# Patient Record
Sex: Female | Born: 1975 | Race: White | Hispanic: No | Marital: Single | Smoking: Current every day smoker
Health system: Southern US, Community
[De-identification: ages and names within clinical notes are randomized; demographics above are authoritative.]

## PROBLEM LIST (undated history)

## (undated) ENCOUNTER — Inpatient Hospital Stay (HOSPITAL_COMMUNITY): Payer: Self-pay

## (undated) DIAGNOSIS — F32A Depression, unspecified: Secondary | ICD-10-CM

## (undated) DIAGNOSIS — O09299 Supervision of pregnancy with other poor reproductive or obstetric history, unspecified trimester: Secondary | ICD-10-CM

## (undated) DIAGNOSIS — I8390 Asymptomatic varicose veins of unspecified lower extremity: Secondary | ICD-10-CM

## (undated) DIAGNOSIS — R87629 Unspecified abnormal cytological findings in specimens from vagina: Secondary | ICD-10-CM

## (undated) DIAGNOSIS — A630 Anogenital (venereal) warts: Secondary | ICD-10-CM

## (undated) DIAGNOSIS — N879 Dysplasia of cervix uteri, unspecified: Secondary | ICD-10-CM

## (undated) DIAGNOSIS — N390 Urinary tract infection, site not specified: Secondary | ICD-10-CM

## (undated) DIAGNOSIS — L309 Dermatitis, unspecified: Secondary | ICD-10-CM

## (undated) DIAGNOSIS — F329 Major depressive disorder, single episode, unspecified: Secondary | ICD-10-CM

## (undated) DIAGNOSIS — O24419 Gestational diabetes mellitus in pregnancy, unspecified control: Secondary | ICD-10-CM

## (undated) DIAGNOSIS — N898 Other specified noninflammatory disorders of vagina: Secondary | ICD-10-CM

## (undated) HISTORY — DX: Gestational diabetes mellitus in pregnancy, unspecified control: O24.419

## (undated) HISTORY — DX: Dermatitis, unspecified: L30.9

## (undated) HISTORY — DX: Dysplasia of cervix uteri, unspecified: N87.9

## (undated) HISTORY — DX: Depression, unspecified: F32.A

## (undated) HISTORY — PX: COLPOSCOPY: SHX161

## (undated) HISTORY — DX: Unspecified abnormal cytological findings in specimens from vagina: R87.629

## (undated) HISTORY — DX: Major depressive disorder, single episode, unspecified: F32.9

## (undated) HISTORY — DX: Anogenital (venereal) warts: A63.0

## (undated) HISTORY — DX: Other specified noninflammatory disorders of vagina: N89.8

## (undated) HISTORY — DX: Asymptomatic varicose veins of unspecified lower extremity: I83.90

## (undated) HISTORY — DX: Supervision of pregnancy with other poor reproductive or obstetric history, unspecified trimester: O09.299

---

## 1997-08-01 HISTORY — PX: WISDOM TOOTH EXTRACTION: SHX21

## 1998-02-23 ENCOUNTER — Other Ambulatory Visit: Admission: RE | Admit: 1998-02-23 | Discharge: 1998-02-23 | Payer: Self-pay | Admitting: Obstetrics and Gynecology

## 1998-09-15 ENCOUNTER — Other Ambulatory Visit: Admission: RE | Admit: 1998-09-15 | Discharge: 1998-09-15 | Payer: Self-pay | Admitting: Family Medicine

## 1998-10-27 ENCOUNTER — Inpatient Hospital Stay (HOSPITAL_COMMUNITY): Admission: AD | Admit: 1998-10-27 | Discharge: 1998-10-27 | Payer: Self-pay | Admitting: *Deleted

## 1998-11-03 ENCOUNTER — Inpatient Hospital Stay (HOSPITAL_COMMUNITY): Admission: AD | Admit: 1998-11-03 | Discharge: 1998-11-03 | Payer: Self-pay | Admitting: *Deleted

## 1998-11-28 ENCOUNTER — Inpatient Hospital Stay (HOSPITAL_COMMUNITY): Admission: AD | Admit: 1998-11-28 | Discharge: 1998-11-28 | Payer: Self-pay | Admitting: Obstetrics

## 1998-12-22 ENCOUNTER — Inpatient Hospital Stay (HOSPITAL_COMMUNITY): Admission: AD | Admit: 1998-12-22 | Discharge: 1998-12-22 | Payer: Self-pay | Admitting: *Deleted

## 1998-12-24 ENCOUNTER — Inpatient Hospital Stay (HOSPITAL_COMMUNITY): Admission: AD | Admit: 1998-12-24 | Discharge: 1998-12-24 | Payer: Self-pay | Admitting: Obstetrics & Gynecology

## 1999-01-02 ENCOUNTER — Inpatient Hospital Stay (HOSPITAL_COMMUNITY): Admission: AD | Admit: 1999-01-02 | Discharge: 1999-01-02 | Payer: Self-pay | Admitting: *Deleted

## 1999-01-06 ENCOUNTER — Inpatient Hospital Stay (HOSPITAL_COMMUNITY): Admission: AD | Admit: 1999-01-06 | Discharge: 1999-01-06 | Payer: Self-pay | Admitting: Obstetrics

## 1999-01-15 ENCOUNTER — Inpatient Hospital Stay (HOSPITAL_COMMUNITY): Admission: AD | Admit: 1999-01-15 | Discharge: 1999-01-15 | Payer: Self-pay | Admitting: Obstetrics

## 1999-01-22 ENCOUNTER — Inpatient Hospital Stay (HOSPITAL_COMMUNITY): Admission: AD | Admit: 1999-01-22 | Discharge: 1999-01-22 | Payer: Self-pay | Admitting: Obstetrics

## 1999-01-27 ENCOUNTER — Inpatient Hospital Stay (HOSPITAL_COMMUNITY): Admission: AD | Admit: 1999-01-27 | Discharge: 1999-01-27 | Payer: Self-pay | Admitting: Obstetrics

## 1999-01-29 ENCOUNTER — Encounter: Payer: Self-pay | Admitting: Obstetrics

## 1999-01-29 ENCOUNTER — Ambulatory Visit (HOSPITAL_COMMUNITY): Admission: RE | Admit: 1999-01-29 | Discharge: 1999-01-29 | Payer: Self-pay | Admitting: Obstetrics

## 1999-02-06 ENCOUNTER — Inpatient Hospital Stay (HOSPITAL_COMMUNITY): Admission: AD | Admit: 1999-02-06 | Discharge: 1999-02-06 | Payer: Self-pay | Admitting: Obstetrics

## 1999-02-07 ENCOUNTER — Inpatient Hospital Stay (HOSPITAL_COMMUNITY): Admission: AD | Admit: 1999-02-07 | Discharge: 1999-02-07 | Payer: Self-pay | Admitting: Obstetrics & Gynecology

## 1999-02-16 ENCOUNTER — Inpatient Hospital Stay (HOSPITAL_COMMUNITY): Admission: AD | Admit: 1999-02-16 | Discharge: 1999-02-16 | Payer: Self-pay | Admitting: *Deleted

## 1999-02-22 ENCOUNTER — Inpatient Hospital Stay (HOSPITAL_COMMUNITY): Admission: AD | Admit: 1999-02-22 | Discharge: 1999-02-22 | Payer: Self-pay | Admitting: Obstetrics

## 1999-03-08 ENCOUNTER — Inpatient Hospital Stay (HOSPITAL_COMMUNITY): Admission: AD | Admit: 1999-03-08 | Discharge: 1999-03-08 | Payer: Self-pay | Admitting: Obstetrics & Gynecology

## 1999-03-09 ENCOUNTER — Inpatient Hospital Stay (HOSPITAL_COMMUNITY): Admission: RE | Admit: 1999-03-09 | Discharge: 1999-03-09 | Payer: Self-pay | Admitting: Obstetrics & Gynecology

## 1999-03-29 ENCOUNTER — Inpatient Hospital Stay (HOSPITAL_COMMUNITY): Admission: AD | Admit: 1999-03-29 | Discharge: 1999-03-29 | Payer: Self-pay | Admitting: *Deleted

## 1999-04-13 ENCOUNTER — Ambulatory Visit (HOSPITAL_COMMUNITY): Admission: RE | Admit: 1999-04-13 | Discharge: 1999-04-13 | Payer: Self-pay | Admitting: *Deleted

## 1999-05-03 ENCOUNTER — Inpatient Hospital Stay (HOSPITAL_COMMUNITY): Admission: AD | Admit: 1999-05-03 | Discharge: 1999-05-03 | Payer: Self-pay | Admitting: Obstetrics

## 1999-05-06 ENCOUNTER — Inpatient Hospital Stay (HOSPITAL_COMMUNITY): Admission: AD | Admit: 1999-05-06 | Discharge: 1999-05-06 | Payer: Self-pay | Admitting: Obstetrics & Gynecology

## 1999-05-11 ENCOUNTER — Inpatient Hospital Stay (HOSPITAL_COMMUNITY): Admission: AD | Admit: 1999-05-11 | Discharge: 1999-05-11 | Payer: Self-pay | Admitting: *Deleted

## 1999-05-28 ENCOUNTER — Inpatient Hospital Stay (HOSPITAL_COMMUNITY): Admission: AD | Admit: 1999-05-28 | Discharge: 1999-05-28 | Payer: Self-pay | Admitting: *Deleted

## 1999-06-18 ENCOUNTER — Inpatient Hospital Stay (HOSPITAL_COMMUNITY): Admission: AD | Admit: 1999-06-18 | Discharge: 1999-06-18 | Payer: Self-pay | Admitting: *Deleted

## 1999-06-24 ENCOUNTER — Inpatient Hospital Stay (HOSPITAL_COMMUNITY): Admission: AD | Admit: 1999-06-24 | Discharge: 1999-06-24 | Payer: Self-pay | Admitting: *Deleted

## 1999-06-25 ENCOUNTER — Encounter (HOSPITAL_COMMUNITY): Admission: RE | Admit: 1999-06-25 | Discharge: 1999-07-01 | Payer: Self-pay | Admitting: *Deleted

## 1999-06-29 ENCOUNTER — Inpatient Hospital Stay (HOSPITAL_COMMUNITY): Admission: AD | Admit: 1999-06-29 | Discharge: 1999-07-04 | Payer: Self-pay | Admitting: *Deleted

## 1999-07-05 ENCOUNTER — Inpatient Hospital Stay (HOSPITAL_COMMUNITY): Admission: EM | Admit: 1999-07-05 | Discharge: 1999-07-05 | Payer: Self-pay | Admitting: Obstetrics & Gynecology

## 1999-07-15 ENCOUNTER — Inpatient Hospital Stay (HOSPITAL_COMMUNITY): Admission: AD | Admit: 1999-07-15 | Discharge: 1999-07-15 | Payer: Self-pay | Admitting: *Deleted

## 1999-08-01 ENCOUNTER — Inpatient Hospital Stay (HOSPITAL_COMMUNITY): Admission: AD | Admit: 1999-08-01 | Discharge: 1999-08-01 | Payer: Self-pay | Admitting: Obstetrics & Gynecology

## 1999-08-26 ENCOUNTER — Inpatient Hospital Stay (HOSPITAL_COMMUNITY): Admission: AD | Admit: 1999-08-26 | Discharge: 1999-08-26 | Payer: Self-pay | Admitting: Obstetrics & Gynecology

## 2000-05-23 ENCOUNTER — Other Ambulatory Visit: Admission: RE | Admit: 2000-05-23 | Discharge: 2000-05-23 | Payer: Self-pay | Admitting: Family Medicine

## 2002-06-04 ENCOUNTER — Other Ambulatory Visit: Admission: RE | Admit: 2002-06-04 | Discharge: 2002-06-04 | Payer: Self-pay | Admitting: Family Medicine

## 2003-11-21 ENCOUNTER — Encounter (INDEPENDENT_AMBULATORY_CARE_PROVIDER_SITE_OTHER): Payer: Self-pay | Admitting: Specialist

## 2003-11-21 ENCOUNTER — Encounter: Admission: RE | Admit: 2003-11-21 | Discharge: 2003-11-21 | Payer: Self-pay | Admitting: Family Medicine

## 2003-11-21 ENCOUNTER — Other Ambulatory Visit: Admission: RE | Admit: 2003-11-21 | Discharge: 2003-11-21 | Payer: Self-pay | Admitting: Family Medicine

## 2003-11-21 ENCOUNTER — Encounter (INDEPENDENT_AMBULATORY_CARE_PROVIDER_SITE_OTHER): Payer: Self-pay | Admitting: *Deleted

## 2003-12-04 ENCOUNTER — Encounter: Admission: RE | Admit: 2003-12-04 | Discharge: 2003-12-04 | Payer: Self-pay | Admitting: Obstetrics and Gynecology

## 2003-12-25 ENCOUNTER — Emergency Department (HOSPITAL_COMMUNITY): Admission: EM | Admit: 2003-12-25 | Discharge: 2003-12-25 | Payer: Self-pay | Admitting: Family Medicine

## 2003-12-26 ENCOUNTER — Emergency Department (HOSPITAL_COMMUNITY): Admission: EM | Admit: 2003-12-26 | Discharge: 2003-12-26 | Payer: Self-pay | Admitting: Family Medicine

## 2004-01-12 ENCOUNTER — Emergency Department (HOSPITAL_COMMUNITY): Admission: EM | Admit: 2004-01-12 | Discharge: 2004-01-12 | Payer: Self-pay | Admitting: Family Medicine

## 2004-01-28 ENCOUNTER — Ambulatory Visit (HOSPITAL_COMMUNITY): Admission: RE | Admit: 2004-01-28 | Discharge: 2004-01-28 | Payer: Self-pay | Admitting: Family Medicine

## 2004-01-28 ENCOUNTER — Encounter: Payer: Self-pay | Admitting: Cardiology

## 2004-04-09 ENCOUNTER — Ambulatory Visit: Payer: Self-pay | Admitting: Internal Medicine

## 2004-04-23 ENCOUNTER — Ambulatory Visit: Payer: Self-pay | Admitting: Internal Medicine

## 2004-06-01 ENCOUNTER — Emergency Department (HOSPITAL_COMMUNITY): Admission: EM | Admit: 2004-06-01 | Discharge: 2004-06-01 | Payer: Self-pay | Admitting: Family Medicine

## 2004-06-03 ENCOUNTER — Ambulatory Visit: Payer: Self-pay | Admitting: Family Medicine

## 2004-06-07 ENCOUNTER — Ambulatory Visit: Payer: Self-pay | Admitting: Internal Medicine

## 2005-03-11 ENCOUNTER — Ambulatory Visit: Payer: Self-pay | Admitting: Internal Medicine

## 2005-03-11 ENCOUNTER — Emergency Department (HOSPITAL_COMMUNITY): Admission: EM | Admit: 2005-03-11 | Discharge: 2005-03-11 | Payer: Self-pay | Admitting: Emergency Medicine

## 2005-03-28 ENCOUNTER — Ambulatory Visit: Payer: Self-pay | Admitting: Internal Medicine

## 2005-05-25 ENCOUNTER — Ambulatory Visit: Payer: Self-pay | Admitting: Internal Medicine

## 2005-07-28 ENCOUNTER — Emergency Department (HOSPITAL_COMMUNITY): Admission: EM | Admit: 2005-07-28 | Discharge: 2005-07-28 | Payer: Self-pay | Admitting: Family Medicine

## 2005-11-16 ENCOUNTER — Ambulatory Visit: Payer: Self-pay | Admitting: Obstetrics & Gynecology

## 2005-12-21 ENCOUNTER — Ambulatory Visit: Payer: Self-pay | Admitting: Obstetrics & Gynecology

## 2005-12-22 ENCOUNTER — Inpatient Hospital Stay (HOSPITAL_COMMUNITY): Admission: AD | Admit: 2005-12-22 | Discharge: 2005-12-22 | Payer: Self-pay | Admitting: Obstetrics & Gynecology

## 2005-12-26 ENCOUNTER — Inpatient Hospital Stay (HOSPITAL_COMMUNITY): Admission: AD | Admit: 2005-12-26 | Discharge: 2005-12-26 | Payer: Self-pay | Admitting: Gynecology

## 2005-12-28 ENCOUNTER — Ambulatory Visit: Payer: Self-pay | Admitting: Obstetrics & Gynecology

## 2006-03-29 ENCOUNTER — Ambulatory Visit: Payer: Self-pay | Admitting: Internal Medicine

## 2006-05-12 ENCOUNTER — Inpatient Hospital Stay (HOSPITAL_COMMUNITY): Admission: AD | Admit: 2006-05-12 | Discharge: 2006-05-13 | Payer: Self-pay | Admitting: Family Medicine

## 2006-06-02 ENCOUNTER — Emergency Department (HOSPITAL_COMMUNITY): Admission: EM | Admit: 2006-06-02 | Discharge: 2006-06-02 | Payer: Self-pay | Admitting: Emergency Medicine

## 2006-10-16 ENCOUNTER — Ambulatory Visit: Payer: Self-pay | Admitting: Internal Medicine

## 2006-10-16 DIAGNOSIS — N946 Dysmenorrhea, unspecified: Secondary | ICD-10-CM | POA: Insufficient documentation

## 2006-10-16 DIAGNOSIS — R002 Palpitations: Secondary | ICD-10-CM

## 2006-10-16 DIAGNOSIS — F329 Major depressive disorder, single episode, unspecified: Secondary | ICD-10-CM

## 2006-10-16 DIAGNOSIS — F410 Panic disorder [episodic paroxysmal anxiety] without agoraphobia: Secondary | ICD-10-CM

## 2006-10-16 DIAGNOSIS — Z87410 Personal history of cervical dysplasia: Secondary | ICD-10-CM

## 2006-10-18 ENCOUNTER — Emergency Department (HOSPITAL_COMMUNITY): Admission: EM | Admit: 2006-10-18 | Discharge: 2006-10-18 | Payer: Self-pay | Admitting: Family Medicine

## 2006-10-26 ENCOUNTER — Telehealth: Payer: Self-pay | Admitting: *Deleted

## 2006-11-20 ENCOUNTER — Ambulatory Visit: Payer: Self-pay | Admitting: Internal Medicine

## 2006-11-20 ENCOUNTER — Encounter (INDEPENDENT_AMBULATORY_CARE_PROVIDER_SITE_OTHER): Payer: Self-pay | Admitting: Internal Medicine

## 2006-11-20 LAB — CONVERTED CEMR LAB
ALT: 10 units/L (ref 0–35)
AST: 16 units/L (ref 0–37)
Albumin: 4.6 g/dL (ref 3.5–5.2)
Alkaline Phosphatase: 57 units/L (ref 39–117)
BUN: 12 mg/dL (ref 6–23)
CO2: 25 meq/L (ref 19–32)
Calcium: 9.5 mg/dL (ref 8.4–10.5)
Chloride: 104 meq/L (ref 96–112)
Creatinine, Ser: 0.82 mg/dL (ref 0.40–1.20)
Glucose, Bld: 87 mg/dL (ref 70–99)
HCT: 44.1 % (ref 36.0–46.0)
Hemoglobin: 14.9 g/dL (ref 12.0–15.0)
MCHC: 33.8 g/dL (ref 30.0–36.0)
MCV: 89.1 fL (ref 78.0–100.0)
Platelets: 341 10*3/uL (ref 150–400)
Potassium: 3.7 meq/L (ref 3.5–5.3)
RBC: 4.95 M/uL (ref 3.87–5.11)
RDW: 12.8 % (ref 11.5–14.0)
Sodium: 140 meq/L (ref 135–145)
TSH: 1.098 microintl units/mL (ref 0.350–5.50)
Total Bilirubin: 0.8 mg/dL (ref 0.3–1.2)
Total Protein: 7.7 g/dL (ref 6.0–8.3)
WBC: 6.2 10*3/uL (ref 4.0–10.5)

## 2007-03-26 ENCOUNTER — Ambulatory Visit: Payer: Self-pay | Admitting: Internal Medicine

## 2007-05-14 ENCOUNTER — Ambulatory Visit: Payer: Self-pay | Admitting: Infectious Diseases

## 2007-06-27 ENCOUNTER — Emergency Department (HOSPITAL_COMMUNITY): Admission: EM | Admit: 2007-06-27 | Discharge: 2007-06-27 | Payer: Self-pay | Admitting: Family Medicine

## 2008-02-27 ENCOUNTER — Ambulatory Visit: Payer: Self-pay | Admitting: Internal Medicine

## 2008-02-27 LAB — CONVERTED CEMR LAB: Beta hcg, urine, semiquantitative: NEGATIVE

## 2008-05-07 ENCOUNTER — Ambulatory Visit: Payer: Self-pay | Admitting: Infectious Disease

## 2008-08-11 ENCOUNTER — Ambulatory Visit: Payer: Self-pay | Admitting: Internal Medicine

## 2008-08-11 ENCOUNTER — Encounter: Payer: Self-pay | Admitting: Internal Medicine

## 2008-08-11 DIAGNOSIS — L708 Other acne: Secondary | ICD-10-CM | POA: Insufficient documentation

## 2008-08-11 LAB — CONVERTED CEMR LAB
ALT: 9 units/L (ref 0–35)
AST: 15 units/L (ref 0–37)
Albumin: 4.2 g/dL (ref 3.5–5.2)
Alkaline Phosphatase: 57 units/L (ref 39–117)
BUN: 8 mg/dL (ref 6–23)
Basophils Absolute: 0.1 10*3/uL (ref 0.0–0.1)
Basophils Relative: 1 % (ref 0–1)
CO2: 20 meq/L (ref 19–32)
Calcium: 9.4 mg/dL (ref 8.4–10.5)
Chloride: 105 meq/L (ref 96–112)
Cholesterol: 144 mg/dL (ref 0–200)
Creatinine, Ser: 0.68 mg/dL (ref 0.40–1.20)
Eosinophils Absolute: 0.2 10*3/uL (ref 0.0–0.7)
Eosinophils Relative: 3 % (ref 0–5)
Glucose, Bld: 81 mg/dL (ref 70–99)
HCT: 42.5 % (ref 36.0–46.0)
HDL: 57 mg/dL (ref >39)
Hemoglobin: 14 g/dL (ref 12.0–15.0)
LDL Cholesterol: 68 mg/dL (ref 0–99)
Lymphocytes Relative: 17 % (ref 12–46)
Lymphs Abs: 1.3 10*3/uL (ref 0.7–4.0)
MCHC: 32.9 g/dL (ref 30.0–36.0)
MCV: 88.5 fL (ref 78.0–100.0)
Monocytes Absolute: 0.7 10*3/uL (ref 0.1–1.0)
Monocytes Relative: 9 % (ref 3–12)
Neutro Abs: 5.6 10*3/uL (ref 1.7–7.7)
Neutrophils Relative %: 71 % (ref 43–77)
Platelets: 320 10*3/uL (ref 150–400)
Potassium: 4.9 meq/L (ref 3.5–5.3)
RBC: 4.8 M/uL (ref 3.87–5.11)
RDW: 12.7 % (ref 11.5–15.5)
Sodium: 142 meq/L (ref 135–145)
Total Bilirubin: 0.8 mg/dL (ref 0.3–1.2)
Total CHOL/HDL Ratio: 2.5
Total Protein: 7.4 g/dL (ref 6.0–8.3)
Triglycerides: 95 mg/dL (ref <150)
VLDL: 19 mg/dL (ref 0–40)
WBC: 7.9 10*3/uL (ref 4.0–10.5)

## 2008-10-13 ENCOUNTER — Encounter (INDEPENDENT_AMBULATORY_CARE_PROVIDER_SITE_OTHER): Payer: Self-pay | Admitting: Internal Medicine

## 2008-10-13 ENCOUNTER — Emergency Department (HOSPITAL_COMMUNITY): Admission: EM | Admit: 2008-10-13 | Discharge: 2008-10-13 | Payer: Self-pay | Admitting: Emergency Medicine

## 2008-10-27 ENCOUNTER — Encounter (INDEPENDENT_AMBULATORY_CARE_PROVIDER_SITE_OTHER): Payer: Self-pay | Admitting: Internal Medicine

## 2008-10-27 ENCOUNTER — Ambulatory Visit: Payer: Self-pay | Admitting: Internal Medicine

## 2008-10-27 LAB — CONVERTED CEMR LAB
BUN: 11 mg/dL (ref 6–23)
CO2: 25 meq/L (ref 19–32)
Calcium: 9 mg/dL (ref 8.4–10.5)
Chloride: 101 meq/L (ref 96–112)
Creatinine, Ser: 0.71 mg/dL (ref 0.40–1.20)
Free T4: 0.91 ng/dL (ref 0.89–1.80)
GFR calc Af Amer: >60 mL/min (ref >60)
GFR calc non Af Amer: >60 mL/min (ref >60)
Glucose, Bld: 91 mg/dL (ref 70–99)
Magnesium: 2 mg/dL (ref 1.5–2.5)
Potassium: 3.8 meq/L (ref 3.5–5.3)
Sodium: 135 meq/L (ref 135–145)
TSH: 2.295 microintl units/mL (ref 0.350–4.500)

## 2008-10-30 ENCOUNTER — Telehealth: Payer: Self-pay | Admitting: *Deleted

## 2008-11-24 ENCOUNTER — Ambulatory Visit: Payer: Self-pay | Admitting: Cardiovascular Disease

## 2009-02-16 ENCOUNTER — Emergency Department (HOSPITAL_COMMUNITY): Admission: EM | Admit: 2009-02-16 | Discharge: 2009-02-16 | Payer: Self-pay | Admitting: Family Medicine

## 2009-04-29 ENCOUNTER — Ambulatory Visit: Payer: Self-pay | Admitting: Infectious Diseases

## 2009-04-29 DIAGNOSIS — R42 Dizziness and giddiness: Secondary | ICD-10-CM

## 2009-04-29 DIAGNOSIS — R197 Diarrhea, unspecified: Secondary | ICD-10-CM

## 2009-04-29 DIAGNOSIS — N951 Menopausal and female climacteric states: Secondary | ICD-10-CM

## 2009-05-25 ENCOUNTER — Emergency Department (HOSPITAL_COMMUNITY): Admission: EM | Admit: 2009-05-25 | Discharge: 2009-05-25 | Payer: Self-pay | Admitting: Family Medicine

## 2009-07-18 ENCOUNTER — Emergency Department (HOSPITAL_COMMUNITY): Admission: EM | Admit: 2009-07-18 | Discharge: 2009-07-19 | Payer: Self-pay | Admitting: Emergency Medicine

## 2010-02-10 ENCOUNTER — Telehealth: Payer: Self-pay | Admitting: *Deleted

## 2010-02-18 ENCOUNTER — Telehealth: Payer: Self-pay | Admitting: *Deleted

## 2010-02-26 ENCOUNTER — Telehealth: Payer: Self-pay | Admitting: *Deleted

## 2010-03-11 ENCOUNTER — Ambulatory Visit: Payer: Self-pay | Admitting: Internal Medicine

## 2010-03-11 DIAGNOSIS — N898 Other specified noninflammatory disorders of vagina: Secondary | ICD-10-CM | POA: Insufficient documentation

## 2010-03-11 LAB — CONVERTED CEMR LAB: Beta hcg, urine, semiquantitative: NEGATIVE

## 2010-03-12 LAB — CONVERTED CEMR LAB
Candida species: NEGATIVE
Gardnerella vaginalis: NEGATIVE

## 2010-04-27 ENCOUNTER — Ambulatory Visit: Payer: Self-pay | Admitting: Internal Medicine

## 2010-08-31 NOTE — Progress Notes (Signed)
Summary: call/ hla  Phone Note Call from Patient   Summary of Call: pt called, rtc, left message, wanted to ask a question??? Initial call taken by: Marin Roberts RN,  February 26, 2010 5:48 PM

## 2010-08-31 NOTE — Progress Notes (Signed)
Summary: PAP/ hla  Phone Note Call from Patient   Summary of Call: pt calls and states she was told at [facility] clinic that they do PAP's every 3 yrs not yrly, she wants to have a PAP every yr. spoke w/ dr Phillips Odor, will schedule pt, tried to call her back got vmail, left message to rtc Initial call taken by: Marin Roberts RN,  February 18, 2010 3:52 PM

## 2010-08-31 NOTE — Assessment & Plan Note (Signed)
Summary: T.B/ FLU/ SB.  Nurse Visit   Allergies: 1)  ! Penicillin 2)  ! Bactrim 3)  ! Codeine  Immunizations Administered:  PPD Skin Test:    Vaccine Type: PPD    Site: right forearm    Mfr: Merck    Dose: 0.05 ml    Route: ID    Given by: Marin Roberts RN    Exp. Date: 06/03/2011    Lot #: [id number]    Physician counseled: yes  PPD Results    Date of reading: 04/29/2010    Results: < 5mm    Interpretation: negative  Orders Added: 1)  Admin 1st Vaccine [90471] 2)  Flu Vaccine 63yrs + [11914] 3)  TB Skin Test [86580] 4)  Admin of Any Addtl Vaccine [90472] Flu Vaccine Consent Questions     Do you have a history of severe allergic reactions to this vaccine? no    Any prior history of allergic reactions to egg and/or gelatin? no    Do you have a sensitivity to the preservative Thimersol? no    Do you have a past history of Guillan-Barre Syndrome? no    Do you currently have an acute febrile illness? no    Have you ever had a severe reaction to latex? no    Vaccine information given and explained to patient? yes    Are you currently pregnant? no    Lot Number:[id number]   Exp Date:01/29/2011   Manufacturer: [employer]    Site Given  Left Deltoid IM.Marin Roberts RN  April 27, 2010 3:26 PM  .opcflu
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
## 2010-08-31 NOTE — Progress Notes (Signed)
Summary: phone/gg  Phone Note Call from Patient   Caller: Patient Summary of Call: Pt called with c/o diarrhea and constipation for 2 weeks.  denies blood in stool. She does have abd cramping and bloating. Decrease appetite. She tried organic tea which caused diarrhea.  Will see tomorrow. Initial call taken by: Merrie Roof RN,  February 10, 2010 3:53 PM

## 2010-09-03 ENCOUNTER — Encounter: Payer: Self-pay | Admitting: Internal Medicine

## 2010-09-03 ENCOUNTER — Other Ambulatory Visit: Payer: Self-pay | Admitting: Internal Medicine

## 2010-09-03 ENCOUNTER — Ambulatory Visit (INDEPENDENT_AMBULATORY_CARE_PROVIDER_SITE_OTHER): Payer: Self-pay | Admitting: Internal Medicine

## 2010-09-03 VITALS — BP 107/66 | HR 69 | Temp 97.7°F | Ht 60.0 in | Wt 107.1 lb

## 2010-09-03 DIAGNOSIS — F329 Major depressive disorder, single episode, unspecified: Secondary | ICD-10-CM

## 2010-09-03 DIAGNOSIS — N898 Other specified noninflammatory disorders of vagina: Secondary | ICD-10-CM

## 2010-09-03 MED ORDER — METRONIDAZOLE 500 MG PO TABS: 500.0000 mg | ORAL_TABLET | Freq: Two times a day (BID) | ORAL | Status: AC

## 2010-09-03 MED ORDER — FLUCONAZOLE 150 MG PO TABS: 150.0000 mg | ORAL_TABLET | Freq: Once | ORAL | Status: AC

## 2010-09-03 NOTE — Patient Instructions (Signed)
Please call us if discharge gets worse.

## 2010-09-04 LAB — GC/CHLAMYDIA PROBE AMP, GENITAL
Chlamydia, DNA Probe: NEGATIVE
GC Probe Amp, Genital: NEGATIVE

## 2010-09-06 ENCOUNTER — Encounter: Payer: Self-pay | Admitting: Internal Medicine

## 2010-09-06 LAB — WET PREP BY MOLECULAR PROBE
Candida species: NEGATIVE
Trichomonas vaginosis: NEGATIVE

## 2010-09-06 LAB — C. TRACHOMATIS / N. GONORRHOEAE, DNA PROBE: Candida species: NEGATIVE

## 2010-09-06 NOTE — Assessment & Plan Note (Signed)
Currently stable.

## 2010-09-06 NOTE — Progress Notes (Signed)
  Subjective:    Patient ID: Sara Montoya, female    DOB: 03/31/1976, 35 y.o.   MRN: [id number]  HPI 35 yo female presents to [facility] with main concern of worsening vaginal discharge over the past week. The discharge is white to yellowish in color, associated with occasional post coital bleeding. She has had similar episodes in the past but reports this is the worst episode yet. She is currently sexually active and is not using any protection except oral contraceptives. She has history of sexually transmitted diseases. She denies malodorous discharge, no external vaginal lesions noted, no itching, no dyspareunia, no abdominal or urinary concerns, no systemic symptoms.   Review of Systems  Constitutional: Negative.   Respiratory: Negative.   Cardiovascular: Negative.   Gastrointestinal: Negative.   Genitourinary: Positive for vaginal discharge. Negative for dysuria, urgency, frequency, hematuria, flank pain, enuresis, difficulty urinating, genital sores, vaginal pain, menstrual problem, pelvic pain and dyspareunia.       Objective:   Physical Exam  Constitutional: She appears well-developed and well-nourished. No distress.  Cardiovascular: Normal rate, regular rhythm and intact distal pulses.  Exam reveals no gallop and no friction rub.   No murmur heard. Pulmonary/Chest: Effort normal and breath sounds normal. No respiratory distress. She has no wheezes. She has no rales. She exhibits no tenderness.  Abdominal: Soft. Bowel sounds are normal. She exhibits no distension and no mass. There is no tenderness. There is no rebound and no guarding. Hernia confirmed negative in the right inguinal area and confirmed negative in the left inguinal area.  Genitourinary: There is no rash, tenderness, lesion or injury on the right labia. There is no rash, tenderness, lesion or injury on the left labia. Uterus is not enlarged and not tender. Cervix exhibits discharge. Cervix exhibits no motion tenderness  and no friability. Right adnexum displays no mass, no tenderness and no fullness. Left adnexum displays no mass, no tenderness and no fullness. No erythema, tenderness or bleeding around the vagina. No signs of injury around the vagina. Vaginal discharge found.  Lymphadenopathy:       Right: No inguinal adenopathy present.       Left: No inguinal adenopathy present.  Skin: She is not diaphoretic.          Assessment & Plan:

## 2010-09-06 NOTE — Assessment & Plan Note (Signed)
This appears to be chronic problem for the patient and she has had multiple negative GC/Chlamydia tests, however she has high risk sexual behavior which puts her at risk for STDs so today we have focused on discussing risk for developing the STD's. We have talked about efficacy of contraception methods. Will obtain GC/Chlamydia test again and will also do wet prep. I will treat her with metronidazole for now and will readjust the regimen based on the test results.

## 2010-09-10 ENCOUNTER — Telehealth: Payer: Self-pay | Admitting: *Deleted

## 2010-09-10 DIAGNOSIS — N898 Other specified noninflammatory disorders of vagina: Secondary | ICD-10-CM

## 2010-09-10 NOTE — Telephone Encounter (Signed)
Pt calls from ph# [phone], she states she would like dr Aldine Contes to call her w/ lab  results asap.

## 2010-09-13 NOTE — Assessment & Plan Note (Signed)
Pt called at home to discuss the results of the tests performed on the last visit. She was not at home to answer the phone but I have left a message to call us back. Her test results for GC/Chlamydia are negative.

## 2010-11-05 LAB — GLUCOSE, CAPILLARY: Glucose-Capillary: 81 mg/dL (ref 70–99)

## 2010-11-24 ENCOUNTER — Telehealth: Payer: Self-pay | Admitting: *Deleted

## 2010-11-24 ENCOUNTER — Inpatient Hospital Stay (INDEPENDENT_AMBULATORY_CARE_PROVIDER_SITE_OTHER)
Admission: RE | Admit: 2010-11-24 | Discharge: 2010-11-24 | Disposition: A | Discharge status: Home / Self Care | Payer: Self-pay | Source: Ambulatory Visit | Attending: Family Medicine | Admitting: Family Medicine

## 2010-11-24 DIAGNOSIS — J029 Acute pharyngitis, unspecified: Secondary | ICD-10-CM

## 2010-11-24 LAB — POCT RAPID STREP A (OFFICE): Streptococcus, Group A Screen (Direct): NEGATIVE

## 2010-11-24 NOTE — Telephone Encounter (Signed)
Agree 

## 2010-11-24 NOTE — Telephone Encounter (Signed)
Call from pt would like to get an appointment for today.  Pt has a sore throat along with a fever.  Attempts to call pt x2 messages were left on pt voice mail.  Pt did return the call after 3:30 PM .  Pt to go to the Urgent Care as she has been out of work for 2 days.

## 2010-12-17 NOTE — Group Therapy Note (Signed)
[name], Sara Montoya NO.:  [id number]   MEDICAL RECORD NO.:  [id number]          PATIENT TYPE:  WOC   LOCATION:  [facility]                   FACILITY:  [facility]   PHYSICIAN:  Elsie Lincoln, MD      DATE OF BIRTH:  23-Mar-1976   DATE OF SERVICE:  12/21/2005                                    CLINIC NOTE   The patient presents today for Bartholin's gland I&D.  A Word catheter was  placed.  The patient took Ativan for preop medication and did very well.  The area was prepared and draped in normal sterile fashion and 2 mL of 1%  lidocaine was injected with a 25-gauge needle.  The gland was I&D'd and a  Word catheter placed.  Approximately 4 mL of sterile water was placed into  the bulb.  The patient tolerated the procedure well.  The patient was told  to leave the catheter in place six weeks, no sex or vigorous exercise;  however, bathing, showering, walking is okay.  The patient can come back in  six weeks for catheter removal.           ______________________________  Elsie Lincoln, MD     KL/MEDQ  D:  12/21/2005  T:  12/22/2005  Job:  [id number]

## 2010-12-17 NOTE — Op Note (Signed)
[facility]  Patient:    Sara Montoya                      MRN: [id number] Proc. Date: 07/01/99 Adm. Date:  [id number] Attending:  Michaelle Copas CC:         [facility]                           Operative Report  PREOPERATIVE DIAGNOSES:       Term intrauterine pregnancy, preeclampsia, failure to descend plus occipitoposterior presentation.  POSTOPERATIVE DIAGNOSES:  PROCEDURE:                    Primary low segment transverse cesarean section.  SURGEON:                      Lenoard Aden, M.D.  ASSISTANT:                    ______ .  ANESTHESIA:                   Epidural.  ESTIMATED BLOOD LOSS:         1000 cc.  COMPLICATIONS:                None.  COUNTS:                       Correct.  DISPOSITION:                  Patient to recovery in good condition.  DESCRIPTION OF PROCEDURE:     After being apprised of the risks of anesthesia, infection and bleeding, patient is brought to the operating room, prepped and draped usual sterile fashion and dosed with an adequate dose of her epidural anesthetic.  After being prepped and draped in the usual sterile fashion, Foley  catheter placed.  A Pfannenstiel skin incision is made with a scalpel and carried down to the fascia, which is nicked in the midline, and opened transversely using Mayo scissors.  Rectus muscle are dissected sharply in the midline, peritoneum entered sharply, visceroperitoneum scored in a smile-like fashion, uterus is scored with a Sharl Ma hysterotomy incision using a scalpel, with delivery from a direct occipitoposterior position of an 8-pound 13-ounce female, handed to pediatricians in attendance, Apgars of 8/9 noted.  Uterus exteriorized, minimally boggy due to magnesium sulfate effect; contracts well after adequate Pitocin.  Normal tubes nd ovaries noted.  Uterine incision is closed using a 0 Vicryl in continuous running fashion; good hemostasis  noted.  Bladder flap inspected.  Paracolic gutters irrigated and all blood clots subsequently removed; good hemostasis achieved. Fascia closed using a 0 Vicryl in continuous-running fashion and skin closed using staples.  Patient tolerates procedure well and to recovery in good condition. DD:  07/01/99 TD:  07/01/99 Job: [id number]

## 2010-12-17 NOTE — Group Therapy Note (Signed)
NAMEBEATRIZ, Montoya NO.:  [id number]   MEDICAL RECORD NO.:  [id number]          PATIENT TYPE:  WOC   LOCATION:  [facility]                   FACILITY:  [facility]   PHYSICIAN:  Tinnie Gens, MD        DATE OF BIRTH:  [birth year]   DATE OF SERVICE:                                    CLINIC NOTE   CHIEF COMPLAINT:  Followup six months Pap.   SUBJECTIVE:  [name] is a 35 year old white female who is here today for a  six month followup Pap after a colposcopy that showed low grade squamous  epithelial lesion, a CIN I with normal endocervical mucosa.  The patient  also had some questions today about some genital itching and decrease in  discharge, and thinks that she may have a yeast infection.   OBJECTIVE:  Vital signs per chart.  GENERAL:  In no acute distress, thin, white female.  PELVIC:  External genitalia is normal appearing with pink vaginal mucosa.  Pap smear was performed and a wet prep done showing a few clue cells.   ASSESSMENT/PLAN:  1.  Pap smear, will followup results with letter or phone call and have her      come back for a repeat Pap in six months.  2.  Bacterial vaginitis.  Metronidazole 500 mg p.o. b.i.d. x7 days.  3.  Otherwise follow up p.r.n.      CM/MEDQ  D:  06/03/2004  T:  06/04/2004  Job:  [id number]

## 2010-12-17 NOTE — Discharge Summary (Signed)
[facility]  Patient:    Sara Montoya                      MRN: [id number] Adm. Date:  [id number] Disc. Date: 07/04/99 Attending:  Antionette Char Dictator:   Andrey Spearman, M.D.                           Discharge Summary  ADMISSION DIAGNOSES:          1. Intrauterine pregnancy post dates at 40-6/7ths                                  weeks.                               2. Pre-eclampsia.  DISCHARGE DIAGNOSES:          1. Intrauterine pregnancy post dates at 40-6/7ths                                  weeks.                               2. Pre-eclampsia.                               3. Status post low transverse cesarean section                                  secondary to failure to progress.  DISCHARGE MEDICATIONS:        1. Ibuprofen 800 mg one q.8h. p.r.n. pain.                               2. Percocet 325/5 one q.6h. p.r.n. pain.                               3. Ortho-Cyclen one q.d.                               4. Colace 100 mg q.d.                               5. Prenatal vitamins one q.d.  BRIEF ADMISSION HISTORY AND PHYSICAL:                     This patient is a 35 year old G1, P0 who was admitted at 40-6/7ths weeks consistent with LMP and ultrasound at 19 weeks.  Patient had  been seen at [facility] earlier in the day and [facility] reported some variables seen on fetal heart rate monitoring and sent her to maternity admissions.  She had previously been scheduled for induction of labor secondary to post dates prior to this.  Patient also had reported increased swelling and visual changes  over the past couple of days and patients blood  pressure on admission was 141/92, up from 90/52 earlier in the pregnancy.  HOSPITAL COURSE:              Patient was admitted for induction of labor secondary to post dates and increasing blood pressure, swelling and visual changes. Patients initial labs on admission showed  platelets down to 140, SGOT at 48, albumin decreased at 3.  Her LDH and uric acid were normal.  Patient was admitted and started on magnesium.  Patient was given Cytotec for induction of labor and  Pitocin was eventually begun to facilitate induction.  Patient received an epidural in labor.  Patients blood pressures remained stable throughout her labor process. After progressing to complete, patient began pushing; however after many hours f pushing, patient was not progressing.  On July 01, 1999 patient was taken for low transverse cesarean section secondary to failure to progress.  Please see operative dictation.  Patient delivered a healthy female infant with Apgars of  and 9.  Status post C-section patient was transferred to the adult intensive care unit for continued magnesium therapy secondary to pre-eclampsia and received routine postpartum care.  Patients blood pressures remained stable while in the  adult ICU.  Patient had brief period of oxygen desaturation which was improved y coughing and ambulation.  The patients reflexes were 3+ without any clonus. Patient remained symptom free.  Patients labs on July 03, 1999 showed platelets at 160, AST 29, ALT 24, uric acid 4.4, LDH 225, and a magnesium level of 6.  As  patients blood pressure remained stable throughout her stay in intensive care, nd her LFTs were decreasing, she was without symptoms.  Her magnesium was discontinued and she was observed off magnesium.  Patient also started to develop constipation which was resolved with Dulcolax suppositories while in the ICU.  Patients saturations after the initial period of desaturation remained 95-98%.  On discharge, patients concerns included abdominal distention and patient was having normal bowel movements however despite her complaints of abdominal distention.  She had good bowel movements and was tolerating regular diet.  It s my impression that this abdominal  distention is probably just weakening of the abdominal muscles secondary to pregnancy.  Patient also did have lower extremity swelling; however I encouraged patient to walking to try to mobilize fluid back  into the intravascular space and when she was sitting or lying, to try to keep er feet elevated.  I discussed this with patient and patient agreed with these plans. We did give patient Tylenol and Percocet for pain; however told patient that Percocet could increase her constipation so I would use it sparingly, only if ibuprofen did not relieve her pain.  Also gave patient prescription for Colace o continue having normal bowel movements.  Encouraged patient to continue taking prenatal vitamins.  Patient opted for Ortho-Cyclen for birth control and instructed patient to begin this the second Sunday after discharge.  Patient is bottle feeding her child.  I instructed patient to return in two days for blood pressure check on the maternity admissions unit.  Upon discharge, patients blood pressure is 127/66 and patient denied headache, shortness of breath.  Reflexes are 2+, no clonus.  Patient did have 2+ lower extremity edema; however no upper extremity or facial  edema.  CONDITION ON DISCHARGE:       Good.  FOLLOWUP:                     She will follow up in two  days at maternity admissions for blood pressure check.DD:  07/06/99 TD:  07/06/99 Job: [id number]

## 2010-12-17 NOTE — Group Therapy Note (Signed)
NAME:  Sara Montoya, Sara Montoya NO.:  [id number]   MEDICAL RECORD NO.:  [id number]          PATIENT TYPE:  WOC   LOCATION:  [facility]                   FACILITY:  [facility]   PHYSICIAN:  Elsie Lincoln, MD      DATE OF BIRTH:  [birth year]   DATE OF SERVICE:                                    CLINIC NOTE   The patient is a 35 year old G1, P54, female who was sent to [place] for a left  labial cyst.  The patient states it has been there for greater than a year  and it is becoming larger and more painful.  She has not had any drainage or  fever.  Her significant GYN history is that she is followed for persistent  low-grade.  Her last Pap smear was December 2006, and it was low-grade.  She  is due again in January.  She has had colposcopy-directed biopsies.   PHYSICAL EXAMINATION:  VITAL SIGNS:  Temperature 99.4, pulse 83, blood  pressure 97/65, weight 99.8.  EXAM:  Tanner V.  Vulva:  There is a left cyst at the area of the  Bartholin's gland approximately 3 x 2 cm.  It is mobile.  There is no  erythema.  There is no exudate.  It is not exquisitely painful but a little  tender.  There is no evidence of this being an abscess.   The patient and I had a long discussion about her fear of pain and that she  does not know if she could tolerate this.  We decided together that we will  give her Ativan 2 mg one tablet the night before and 2 mg once she gets here  and then we will proceed with the procedure.  We will try for a Word  catheter; however, if we are just able only to drain it, then we will do  that.  The patient verbalizes understanding and was given a prescription for  Ativan.  Return to clinic in three weeks.           ______________________________  Elsie Lincoln, MD     KL/MEDQ  D:  11/16/2005  T:  11/17/2005  Job:  [id number]

## 2010-12-17 NOTE — Group Therapy Note (Signed)
[name], Sara Montoya NO.:  [id number]   MEDICAL RECORD NO.:  [id number]          PATIENT TYPE:  WOC   LOCATION:  [facility]                   FACILITY:  [facility]   PHYSICIAN:  Elsie Lincoln, MD      DATE OF BIRTH:  [birth year]   DATE OF SERVICE:                                    CLINIC NOTE   CLINIC NOTE - Dec 28, 2005.   HISTORY:  35 year old female comes in today status post Word catheter  placement for Bartholin's.  The patient is concerned that the area still has  a swelling and that it is infected.  There is good placement of the  catheter.  There is no evidence of infection.  The patient was reassured.  The patient does have a low pain tolerance and now that the catheter is up  inside the vagina she feels much better.  We did give her some Lidocaine 5%  ointment p.r.n. to place on the area externally and I encouraged her to  allow the catheter to stay in for five weeks until we can take the catheter  out and she can no longer have problems with the Bartholin's gland.  The  patient understood this.  She is using ibuprofen during the day and Darvocet  at night for pain.   OBJECTIVE:  There is no evidence of infection.  Temperature is 98.9.  Other  vital signs stable.   PLAN:  As above.           ______________________________  Elsie Lincoln, MD     KL/MEDQ  D:  12/28/2005  T:  12/28/2005  Job:  [id number]

## 2011-01-04 ENCOUNTER — Ambulatory Visit: Payer: Self-pay | Admitting: Internal Medicine

## 2011-01-10 ENCOUNTER — Encounter: Payer: Self-pay | Admitting: Internal Medicine

## 2011-01-31 ENCOUNTER — Encounter: Payer: Self-pay | Admitting: Internal Medicine

## 2011-04-12 ENCOUNTER — Ambulatory Visit (INDEPENDENT_AMBULATORY_CARE_PROVIDER_SITE_OTHER): Payer: Self-pay | Admitting: *Deleted

## 2011-04-12 DIAGNOSIS — Z111 Encounter for screening for respiratory tuberculosis: Secondary | ICD-10-CM

## 2011-05-12 ENCOUNTER — Ambulatory Visit (INDEPENDENT_AMBULATORY_CARE_PROVIDER_SITE_OTHER): Payer: Self-pay | Admitting: *Deleted

## 2011-05-12 ENCOUNTER — Ambulatory Visit: Payer: Self-pay

## 2011-05-12 DIAGNOSIS — Z23 Encounter for immunization: Secondary | ICD-10-CM

## 2012-03-21 ENCOUNTER — Ambulatory Visit: Payer: Self-pay

## 2012-03-28 ENCOUNTER — Encounter: Payer: Self-pay | Admitting: Internal Medicine

## 2012-03-28 ENCOUNTER — Ambulatory Visit (INDEPENDENT_AMBULATORY_CARE_PROVIDER_SITE_OTHER): Payer: Self-pay | Admitting: *Deleted

## 2012-03-28 DIAGNOSIS — Z111 Encounter for screening for respiratory tuberculosis: Secondary | ICD-10-CM

## 2012-03-30 LAB — TB SKIN TEST

## 2012-04-26 ENCOUNTER — Ambulatory Visit: Payer: Self-pay | Admitting: Internal Medicine

## 2012-05-30 ENCOUNTER — Ambulatory Visit: Payer: Self-pay | Admitting: Internal Medicine

## 2013-06-11 ENCOUNTER — Other Ambulatory Visit (HOSPITAL_COMMUNITY)
Admission: RE | Admit: 2013-06-11 | Discharge: 2013-06-11 | Disposition: A | Discharge status: Home / Self Care | Payer: 59 | Source: Ambulatory Visit | Attending: Family | Admitting: Family

## 2013-06-11 ENCOUNTER — Other Ambulatory Visit: Payer: Self-pay

## 2013-06-11 DIAGNOSIS — Z1151 Encounter for screening for human papillomavirus (HPV): Secondary | ICD-10-CM | POA: Insufficient documentation

## 2013-06-11 DIAGNOSIS — Z0000 Encounter for general adult medical examination without abnormal findings: Secondary | ICD-10-CM | POA: Insufficient documentation

## 2013-06-11 DIAGNOSIS — R87810 Cervical high risk human papillomavirus (HPV) DNA test positive: Secondary | ICD-10-CM | POA: Insufficient documentation

## 2013-07-26 ENCOUNTER — Other Ambulatory Visit: Payer: Self-pay | Admitting: Obstetrics & Gynecology

## 2014-04-22 ENCOUNTER — Encounter: Payer: Self-pay | Admitting: Internal Medicine

## 2014-04-22 ENCOUNTER — Ambulatory Visit (INDEPENDENT_AMBULATORY_CARE_PROVIDER_SITE_OTHER): Payer: 59 | Admitting: Internal Medicine

## 2014-04-22 VITALS — BP 100/62 | HR 82 | Temp 98.9°F | Resp 12 | Ht 59.5 in | Wt 106.0 lb

## 2014-04-22 DIAGNOSIS — R946 Abnormal results of thyroid function studies: Secondary | ICD-10-CM

## 2014-04-22 DIAGNOSIS — Z111 Encounter for screening for respiratory tuberculosis: Secondary | ICD-10-CM

## 2014-04-22 LAB — TSH: TSH: 0.37 u[IU]/mL (ref 0.35–4.50)

## 2014-04-22 LAB — T3, FREE: T3 FREE: 3.6 pg/mL (ref 2.3–4.2)

## 2014-04-22 LAB — T4, FREE: Free T4: 1.03 ng/dL (ref 0.60–1.60)

## 2014-04-22 NOTE — Patient Instructions (Addendum)
I believe you had an episode of subacute thyroiditis. Please stop at the lab. Please come back for a follow-up appointment in 4 months.

## 2014-04-22 NOTE — Progress Notes (Signed)
Patient ID: Sara Montoya, female   DOB: 07-19-1976, 38 y.o.   MRN: [id number]   HPI  Sara Montoya is a 38 y.o.-year-old female, referred by her PCP, Dr. Juluis Rainier, in consultation for abnormal thyroid tests.  She started to feel fatigued in the last few mo. She also had severe depression and anxiety >> had labs checked then >> TSH normal but the rest of the thyroid tests were abnormal.  She describes she had a severe episode of sinusitis in 11/2013 >> tx with ABx.   I reviewed pt's thyroid tests - per records from PCP and per the TFTs in Epic: 04/01/2014: TSH 1.40, free T4 1.36 (0.61-1.12), TT3 203 (71-180) 06/2013: normal Lab Results  Component Value Date   TSH 2.295 10/27/2008   TSH 1.098 11/20/2006   FREET4 0.91 10/27/2008    She mentions: - + unintentional weight loss (6-10 lbs - but not per our records - she mentions that ~ 1.5 years ago she weighed 119 lbs and lost weight to current weight of 106 - this is consistent with previous years) - + fatigue - + depression/+ anxiety - + excessive sweating - hot flushes - + tremors - + palpitations - + body aches - + hyperdefecation - + problems with concentration - + insomnia  Pt + feeling nodules in neck, hoarseness, + dysphagia (+ drainage)/odynophagia, SOB with lying down.   Pt does have a FH of thyroid ds: PGM. No FH of thyroid cancer. No h/o radiation tx to head or neck.  No seaweed or kelp, no recent contrast studies. No steroid use. No herbal supplements.   I reviewed her chart and she also has a history of depression.  ROS: Constitutional: see HPI Eyes: no blurry vision, no xerophthalmia ENT: no sore throat, no nodules palpated in throat, no dysphagia/odynophagia, no hoarseness Cardiovascular: no CP/SOB/palpitations/leg swelling Respiratory: no cough/SOB Gastrointestinal: no N/V/D/C Musculoskeletal: no muscle/joint aches Skin: no rashes Neurological: no tremors/numbness/tingling/dizziness Psychiatric:  + depression/+ anxiety  Past Medical History  Diagnosis Date  . Depression   . Vaginal discharge     chronic  . Cervical dysplasia     ASCUS, s/p colposcopy 2005, C1N1   Past Surgical History  Procedure Laterality Date  . Cesarean section     History   Social History  . Marital Status: Single    Spouse Name: N/A    Number of Children: 1   Occupational History  .    Social History Main Topics  . Smoking status: Former Smoker -- 3.30 packs/day for 5 years    Types: Cigarettes  . Smokeless tobacco: Not on file  . Alcohol Use: No  . Drug Use: No   Social History Narrative   Financial assistance approved for 100% discount at [facility] and has [facility] card per Rudell Cobb   07/09/2010   Current Outpatient Prescriptions on File Prior to Visit  Medication Sig Dispense Refill  . Multiple Vitamin (MULTIVITAMIN) tablet Take 1 tablet by mouth daily.        . NON FORMULARY BCP- use as directed       No current facility-administered medications on file prior to visit.   Allergies  Allergen Reactions  . Codeine   . Penicillins     REACTION: Hives  . Sulfamethoxazole-Trimethoprim     REACTION: Hives   Family History  Problem Relation Age of Onset  . Depression Mother   . Heart disease Neg Hx    PE: BP 100/62  Pulse 82  Temp(Src) 98.9 F (37.2 C) (Oral)  Resp 12  Ht 4' 11.5" (1.511 m)  Wt 106 lb (48.081 kg)  BMI 21.06 kg/m2  SpO2 99% Wt Readings from Last 3 Encounters:  04/22/14 106 lb (48.081 kg)  09/03/10 107 lb 1.6 oz (48.58 kg)  03/11/10 105 lb 3.2 oz (47.718 kg)   Constitutional: normal weight, in NAD Eyes: PERRLA, EOMI, no exophthalmos, no lid lag, no stare ENT: moist mucous membranes, no thyromegaly, no thyroid bruits, no cervical lymphadenopathy Cardiovascular: RRR, No MRG Respiratory: CTA B Gastrointestinal: abdomen soft, NT, ND, BS+ Musculoskeletal: no deformities, strength intact in all 4 Skin: moist, warm, no rashes Neurological: no tremor with  outstretched hands, DTR normal in all 4  ASSESSMENT: 1. Abnormal thyroid tests  PLAN:  1. Patient with a recently found high free T4 and TT3, but with normal TSH, with many sxs, including thyrotoxic sxs: weight loss, heat intolerance, hyperdefecation, palpitations, anxiety.  - she does not appear to have exogenous causes for the low TSH.  - We discussed that possible causes of thyrotoxicosis are:  Graves ds   Thyroiditis - most likely, after her URI 4 mo ago toxic multinodular goiter/ toxic adenoma (I cannot feel nodules at palpation of her thyroid). - I suggested that we check the TSH, fT3 and fT4 today - If the tests remain abnormal, we may need an uptake and scan to differentiate between the 3 above possible etiologies  - I do not feel that we need to add beta blockers at this time, since she is not tachycardic or tremulous - I advised her to join my chart to communicate easier, but she refuses - RTC in 4 months, but likely sooner for repeat labs  Office Visit on 04/22/2014  Component Date Value Ref Range Status  . TSH 04/22/2014 0.37  0.35 - 4.50 uIU/mL Final  . Free T4 04/22/2014 1.03  0.60 - 1.60 ng/dL Final  . T3, Free 16/05/9603 3.6  2.3 - 4.2 pg/mL Final   TFTs now normal - she needs to have a repeat in ~6-8 weeks: TSH , free T4 and free T3. It is possible she had subacute thyroiditis, now resolving.

## 2014-04-24 LAB — TB SKIN TEST: TB SKIN TEST: NEGATIVE

## 2014-08-06 ENCOUNTER — Ambulatory Visit: Payer: Self-pay | Admitting: Family Medicine

## 2014-09-17 ENCOUNTER — Encounter: Payer: Self-pay | Admitting: Family Medicine

## 2014-09-17 ENCOUNTER — Ambulatory Visit (INDEPENDENT_AMBULATORY_CARE_PROVIDER_SITE_OTHER): Payer: Self-pay | Admitting: Family Medicine

## 2014-09-17 VITALS — BP 102/74 | HR 94 | Temp 98.1°F | Resp 16 | Ht 60.0 in | Wt 101.6 lb

## 2014-09-17 DIAGNOSIS — R634 Abnormal weight loss: Secondary | ICD-10-CM

## 2014-09-17 DIAGNOSIS — F418 Other specified anxiety disorders: Secondary | ICD-10-CM

## 2014-09-17 DIAGNOSIS — F419 Anxiety disorder, unspecified: Principal | ICD-10-CM

## 2014-09-17 DIAGNOSIS — L659 Nonscarring hair loss, unspecified: Secondary | ICD-10-CM

## 2014-09-17 DIAGNOSIS — F329 Major depressive disorder, single episode, unspecified: Secondary | ICD-10-CM

## 2014-09-17 LAB — POCT URINALYSIS DIPSTICK
Bilirubin, UA: NEGATIVE
Glucose, UA: NEGATIVE
KETONES UA: NEGATIVE
LEUKOCYTES UA: NEGATIVE
Nitrite, UA: NEGATIVE
PH UA: 6
PROTEIN UA: NEGATIVE
RBC UA: NEGATIVE
Spec Grav, UA: 1.02
Urobilinogen, UA: 0.2

## 2014-09-17 MED ORDER — CITALOPRAM HYDROBROMIDE 20 MG PO TABS: 20.0000 mg | ORAL_TABLET | Freq: Every day | ORAL | Status: DC

## 2014-09-17 NOTE — Patient Instructions (Signed)

## 2014-09-17 NOTE — Progress Notes (Signed)
Subjective:    Patient ID: Sara Montoya, female    DOB: 03-10-1976, 39 y.o.   MRN: [id number]  09/17/2014  estab care; weight loss; and thyroid   HPI This 39 y.o. female presents to establish care.  Last physical:  06/2014 [facility].  Pap smear:  06/2014; [facility].  Regular menses.   Mammogram:  never Colonoscopy:  never TDAP:  Due now; 2006 Influenza:  Not yet; yearly; last year got the flu after the injection. Eye exam:  Teenager; no glasses or contacts. Dental exam:  Every six months.  Depression/anxiety/panic attacks: onset one year ago.   Started about the time when started Aviane; noticed excessive fatigue.  No energy to even get up every morning. Then anxiety and depression intensified.  Eczema has greatly worsened on scalp and body.  Hair loss with worsening eczema.  Followed by Terri PiedraLupton for eczema; Terri PiedraLupton discouraged contraception usage.  Lupton prescribed OCP due to cystic acne.  Very tearful.  Family history of depression.  Onset in teenage years.  With age, does not feel like self. No energy.  Excessive sleeping.  Goes to work but crashes after work.  Changed jobs due to stressful work environment; was sleeping all weekend.    No SI.  Previous Lexapro which caused fatigue.  HIstory of Prozac which caused fatigue in early 20s.  Anxiety gets really baad; has panic attacks; cannot drive on interstates.  Bad nerves.  Avoidance of highwasy to two years. Speeds scares patient.   Hair loss: onset one year ago; excessive hair loss during shower; no focal alopecia.  Terri PiedraLupton started Biotin twice daily.  Recommended PCP for levels.  Went to [facility]; quick visits.  Recent thyroid testing.  Not sure if iron levels in past year.  Did check thyroid levels that were off.    Weight loss: fifteen pounds in past year.  Appetite has improved.  While on Aviane, decreased appetite.  Has gained some weight; weight got down to 97 pounds.  Had frequent diarrhea; now that off of Aviane, now having normal stools.   None of the symptoms on Seasonique; stopped Seasonique due to weight gain.  Nausea was proment on Aviane.  No skipped meals just small meals.   Rare fruit.     Review of Systems  Constitutional: Positive for fatigue and unexpected weight change. Negative for fever, chills and diaphoresis.  Eyes: Negative for visual disturbance.  Respiratory: Negative for cough and shortness of breath.   Cardiovascular: Negative for chest pain, palpitations and leg swelling.  Gastrointestinal: Negative for nausea, vomiting, abdominal pain, diarrhea, constipation, blood in stool, anal bleeding and rectal pain.  Endocrine: Negative for cold intolerance, heat intolerance, polydipsia, polyphagia and polyuria.  Skin: Positive for rash.  Neurological: Negative for dizziness, tremors, seizures, syncope, facial asymmetry, speech difficulty, weakness, light-headedness, numbness and headaches.  Psychiatric/Behavioral: Positive for dysphoric mood. Negative for suicidal ideas, sleep disturbance and self-injury. The patient is nervous/anxious.     Past Medical History  Diagnosis Date  . Vaginal discharge     chronic  . Cervical dysplasia     ASCUS, s/p colposcopy 2005, C1N1  . Depression     onset age teenager; menstrually related.  . Thyroid disease 01/29/2014    s/p endocrinology evaluation.   Past Surgical History  Procedure Laterality Date  . Wisdom tooth extraction  1999  . Cesarean section  2000   Allergies  Allergen Reactions  . Codeine   . Penicillins     REACTION: Hives  .  Sulfamethoxazole-Trimethoprim     REACTION: Hives   Current Outpatient Prescriptions  Medication Sig Dispense Refill  . Multiple Vitamin (MULTIVITAMIN) tablet Take 1 tablet by mouth daily.      Marland Kitchen OVER THE COUNTER MEDICATION Biotin 2500 mcg taking twice daily    . OVER THE COUNTER MEDICATION GNC Ultra Potency GLA 500 taking one daily    . citalopram (CELEXA) 20 MG tablet Take 1 tablet (20 mg total) by mouth daily. 30 tablet 3     No current facility-administered medications for this visit.   History   Social History  . Marital Status: Single    Spouse Name: N/A  . Number of Children: N/A  . Years of Education: N/A   Occupational History  . Not on file.   Social History Main Topics  . Smoking status: Former Smoker -- 3.30 packs/day for 5 years    Types: Cigarettes  . Smokeless tobacco: Not on file  . Alcohol Use: No  . Drug Use: No  . Sexual Activity: Yes   Other Topics Concern  . Not on file   Social History Narrative   Financial assistance approved for 100% discount at [facility] and has [facility] card per Rudell Cobb   07/09/2010      Marital status: single; not dating currently      Children:  1 child (15)      Lives: with daughter.      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      Tobacco: none      Alcohol: 4 times per week; 3 glasses wine      Drugs: none      Exercise: walking at work.      Family History  Problem Relation Age of Onset  . Depression Mother   . Hypertension Mother   . Hyperlipidemia Mother   . Heart disease Neg Hx   . Cancer Father     prostate cancer  . Hyperlipidemia Father   . Thyroid disease Paternal Grandmother         Objective:    BP 102/74 mmHg  Pulse 94  Temp(Src) 98.1 F (36.7 C) (Oral)  Resp 16  Ht 5' (1.524 m)  Wt 101 lb 9.6 oz (46.085 kg)  BMI 19.84 kg/m2  SpO2 95%  LMP 09/10/2014 Physical Exam  Constitutional: She is oriented to person, place, and time. She appears well-developed and well-nourished. No distress.  HENT:  Head: Normocephalic and atraumatic.  Right Ear: External ear normal.  Left Ear: External ear normal.  Nose: Nose normal.  Mouth/Throat: Oropharynx is clear and moist.  Eyes: Conjunctivae and EOM are normal. Pupils are equal, round, and reactive to light.  Neck: Normal range of motion. Neck supple. Carotid bruit is not present. No thyromegaly present.  Cardiovascular: Normal rate, regular rhythm,  normal heart sounds and intact distal pulses.  Exam reveals no gallop and no friction rub.   No murmur heard. Pulmonary/Chest: Effort normal and breath sounds normal. She has no wheezes. She has no rales.  Abdominal: Soft. Bowel sounds are normal. She exhibits no distension and no mass. There is no tenderness. There is no rebound and no guarding.  Lymphadenopathy:    She has no cervical adenopathy.  Neurological: She is alert and oriented to person, place, and time. No cranial nerve deficit.  Skin: Skin is warm and dry. No rash noted. She is not diaphoretic. No erythema. No pallor.  Scarring facial from acne.  +mild flaking of  scalp diffusely.  +thinning of hair in frontal region.  Psychiatric: She has a normal mood and affect. Her behavior is normal. Judgment and thought content normal.  Flat affect.   Results for orders placed or performed in visit on 09/17/14  POCT urinalysis dipstick  Result Value Ref Range   Color, UA yellow    Clarity, UA cloudy    Glucose, UA neg    Bilirubin, UA neg    Ketones, UA neg    Spec Grav, UA 1.020    Blood, UA neg    pH, UA 6.0    Protein, UA neg    Urobilinogen, UA 0.2    Nitrite, UA neg    Leukocytes, UA Negative        Assessment & Plan:   1. Anxiety and depression   2. Hair loss   3. Loss of weight     1. Anxiety and depression: uncontrolled; chronic with recent worsening in past year. Intolerant to Lexapro in past.  Rx for Citalopram  daily provided. Follow-up in six weeks.   2.  Hair loss; New. Diffuse thinning in frontal region; obtain TSH and iron levels; continue vitamin supplement as well.  S/p evaluation by Dr. Terri Piedra of dermatology who recommended evaluation by PCP due to variety of symptoms. 3.  Loss of weight: New. Onset with initiation of Aviane OCP due to nausea and diarrhea; appetite has improved with cessation of Avian; weight is reported up by five pounds.  Obtain labs; treat depression/anxiety; close follow-up.  If  persists, will warrant CT abd/pelvis/chest.  Will also warrant GI consultation.     Meds ordered this encounter  Medications  . OVER THE COUNTER MEDICATION    Sig: Biotin 2500 mcg taking twice daily  . OVER THE COUNTER MEDICATION    Sig: GNC Ultra Potency GLA 500 taking one daily  . citalopram (CELEXA) 20 MG tablet    Sig: Take 1 tablet (20 mg total) by mouth daily.    Dispense:  30 tablet    Refill:  3    No Follow-up on file.     Faris Coolman Paulita Fujita, M.D. [facility] [address] [place], [place]  [address] [phone] phone [phone] fax

## 2014-09-18 ENCOUNTER — Encounter: Payer: Self-pay | Admitting: Family Medicine

## 2014-09-18 LAB — CBC WITH DIFFERENTIAL/PLATELET
BASOS ABS: 0.1 10*3/uL (ref 0.0–0.1)
Basophils Relative: 1 % (ref 0–1)
EOS PCT: 1 % (ref 0–5)
Eosinophils Absolute: 0.1 10*3/uL (ref 0.0–0.7)
HCT: 42.1 % (ref 36.0–46.0)
Hemoglobin: 15 g/dL (ref 12.0–15.0)
LYMPHS PCT: 20 % (ref 12–46)
Lymphs Abs: 1.2 10*3/uL (ref 0.7–4.0)
MCH: 31.8 pg (ref 26.0–34.0)
MCHC: 35.6 g/dL (ref 30.0–36.0)
MCV: 89.4 fL (ref 78.0–100.0)
MPV: 9.9 fL (ref 8.6–12.4)
Monocytes Absolute: 0.6 10*3/uL (ref 0.1–1.0)
Monocytes Relative: 10 % (ref 3–12)
NEUTROS ABS: 4 10*3/uL (ref 1.7–7.7)
Neutrophils Relative %: 68 % (ref 43–77)
PLATELETS: 350 10*3/uL (ref 150–400)
RBC: 4.71 MIL/uL (ref 3.87–5.11)
RDW: 12.9 % (ref 11.5–15.5)
WBC: 5.9 10*3/uL (ref 4.0–10.5)

## 2014-09-18 LAB — COMPREHENSIVE METABOLIC PANEL
ALBUMIN: 4.2 g/dL (ref 3.5–5.2)
ALT: 10 U/L (ref 0–35)
AST: 16 U/L (ref 0–37)
Alkaline Phosphatase: 65 U/L (ref 39–117)
BUN: 7 mg/dL (ref 6–23)
CALCIUM: 9 mg/dL (ref 8.4–10.5)
CO2: 24 mEq/L (ref 19–32)
Chloride: 104 mEq/L (ref 96–112)
Creat: 0.66 mg/dL (ref 0.50–1.10)
GLUCOSE: 83 mg/dL (ref 70–99)
POTASSIUM: 4.2 meq/L (ref 3.5–5.3)
Sodium: 137 mEq/L (ref 135–145)
Total Bilirubin: 0.8 mg/dL (ref 0.2–1.2)
Total Protein: 6.8 g/dL (ref 6.0–8.3)

## 2014-09-18 LAB — VITAMIN D 25 HYDROXY (VIT D DEFICIENCY, FRACTURES): VIT D 25 HYDROXY: 10 ng/mL — AB (ref 30–100)

## 2014-09-18 LAB — FOLLICLE STIMULATING HORMONE: FSH: 4.3 m[IU]/mL

## 2014-09-18 LAB — LUTEINIZING HORMONE: LH: 3.6 m[IU]/mL

## 2014-09-18 LAB — T4, FREE: FREE T4: 1.2 ng/dL (ref 0.80–1.80)

## 2014-09-18 LAB — TSH: TSH: 1.48 u[IU]/mL (ref 0.350–4.500)

## 2014-09-18 LAB — VITAMIN B12: VITAMIN B 12: 509 pg/mL (ref 211–911)

## 2014-09-18 LAB — IRON: Iron: 131 ug/dL (ref 42–145)

## 2014-09-19 ENCOUNTER — Telehealth: Payer: Self-pay

## 2014-09-19 NOTE — Telephone Encounter (Signed)
Spoke with pt, she states her anitdepressant is causing her headaches and she has difficulty going to sleep at night. Will these symptoms go away after a couple of weeks? She also read about having low-sex drive but I informed her everybody is different and it may not effect her. We won't know until she continues to take the medication. Please advise. Pt is taking it at night.

## 2014-09-25 NOTE — Telephone Encounter (Signed)
I spoke with pt, I gave her the message from Dr. Katrinka BlazingSmith. Pt does not really want to be on this medication anymore. She would like to try something else if possible. She states you spoke about Prozac but didn't think it was a good choice due to loss of appetite as a side effect. Please advise if we can change her medication.

## 2014-09-25 NOTE — Telephone Encounter (Signed)
Call --- 1.  Most side effects improve within two weeks.  2. If medication is affecting sleep, recommend pt take it in the morning.  3.  If headaches persist, patient could try taking 1/2 tablet of Citalopram daily for two weeks and then increasing back up to one whole tablet daily.

## 2014-09-25 NOTE — Telephone Encounter (Signed)
Left message for pt to call back  °

## 2014-09-26 NOTE — Telephone Encounter (Signed)
Called pt, Left message for pt to call back.  

## 2014-09-26 NOTE — Telephone Encounter (Signed)
Call -- I can switch her to Zoloft or Prozac.  Which does she prefer?

## 2014-09-29 NOTE — Telephone Encounter (Signed)
Left message for pt to call back  °

## 2014-09-30 NOTE — Telephone Encounter (Signed)
Spoke with pt, she would like to speak with Dr. Katrinka BlazingSmith at her next office visit about this. She has not decided which one she wants to try.

## 2014-11-12 ENCOUNTER — Ambulatory Visit: Payer: Self-pay | Admitting: Family Medicine

## 2014-12-09 ENCOUNTER — Telehealth: Payer: Self-pay

## 2014-12-09 NOTE — Telephone Encounter (Signed)
She would like a CB from Dr. Katrinka BlazingSmith to discuss her medications with her further. Please advise at [phone]

## 2014-12-10 NOTE — Telephone Encounter (Signed)
Left message for pt to call back  °

## 2014-12-17 NOTE — Telephone Encounter (Signed)
Patient called asking for a call back regarding switching antidepressant/anxiety medication to something else. Cb# [phone]. Patient of Dr. Katrinka BlazingSmith.

## 2014-12-18 MED ORDER — FLUOXETINE HCL 20 MG PO TABS: ORAL_TABLET | ORAL | Status: DC

## 2014-12-18 NOTE — Telephone Encounter (Signed)
Call --- 1. I have sent in a prescription for Prozac 20mg ; recommend pt taking 1/2 tablet daily for two weeks and then increase to 1 tablet daily.  2. I recommend she decrease Citalopram to 1/2 tablet daily now and continue for two weeks.  After two weeks, she can stop the Citalopram.

## 2014-12-18 NOTE — Telephone Encounter (Signed)
Pt returning Tamara's call regarding her medication.

## 2014-12-18 NOTE — Telephone Encounter (Signed)
Spoke with pt, she states she could not make her appt due to her schedule. She wanted to try another antidepressant like Prestiq or Prozac. She heard these medications have the less side effects. She will come in and follow up with Dr. Katrinka BlazingSmith when her new schedule comes out.

## 2014-12-18 NOTE — Telephone Encounter (Signed)
Left message for pt to call back  °

## 2014-12-18 NOTE — Telephone Encounter (Signed)
Pt notified. She had already stopped taking the citalopram weeks ago.

## 2014-12-20 ENCOUNTER — Telehealth: Payer: Self-pay | Admitting: Family Medicine

## 2014-12-20 NOTE — Telephone Encounter (Signed)
Answering service notified of pt call--- spoke to patient; having frequent urination; concerned about UTI; also having cramping and pressure; took Ibuprofen; lower abdomen.  Frequent UTI after menses; feels similar to previous UTI.  Also having dysuria.  Has been several years since having UTI. A/P:  Dysuria, abdominal pressure:  New onset in past 24 hours. Recommend hydration with water overnight.  Recommend OV if symptoms persist.

## 2014-12-21 ENCOUNTER — Telehealth: Payer: Self-pay | Admitting: Family Medicine

## 2014-12-21 DIAGNOSIS — M549 Dorsalgia, unspecified: Secondary | ICD-10-CM

## 2014-12-21 DIAGNOSIS — Z87440 Personal history of urinary (tract) infections: Secondary | ICD-10-CM

## 2014-12-21 DIAGNOSIS — R300 Dysuria: Secondary | ICD-10-CM

## 2014-12-21 MED ORDER — PHENAZOPYRIDINE HCL 200 MG PO TABS
200.0000 mg | ORAL_TABLET | Freq: Three times a day (TID) | ORAL | Status: DC | PRN
Start: 2014-12-21 — End: 2015-01-19

## 2014-12-21 MED ORDER — FLUCONAZOLE 150 MG PO TABS: 150.0000 mg | ORAL_TABLET | Freq: Once | ORAL | Status: DC

## 2014-12-21 MED ORDER — CIPROFLOXACIN HCL 250 MG PO TABS: 250.0000 mg | ORAL_TABLET | Freq: Two times a day (BID) | ORAL | Status: DC

## 2014-12-21 NOTE — Telephone Encounter (Signed)
I phoned Sara Montoya after receiving a phone call from answering service. Sara Montoya tried to call Dr. Katrinka BlazingSmith, whom she talked w/ last PM. Sara Montoya also tried to get to [address] today to be seen but door was locked. We had a lengthy discussion about her symptoms and hx of UTIs. She states this is her 3rd infection in the last 3-4 months. [facility]; Cipro for 3 days did not work. She is having back pain, pressure and dysuria; symptoms so severe that Sara Montoya had to leave work the other day. Sara Montoya does not have health insurance and OVs put a strain on her finances. Sara Montoya notes that she started having problems w/ UTIs after becoming sexually active recently. She practices good toilet hygiene (wipes front to back) and drinks mostly water and cranberry juices. Sara Montoya is currently at [facility] but will come to [facility] [address] this afternoon. I will have her collect a CCU and take it to [address] to lab to be cultured. If Sara Montoya does not arrive at [facility] before staff has left [address], I will put specimen in the refrigerator and have lab send it out on 12/22/2014 for culture. This is important because we do not have any culture reports in EPIC documenting culture-positive infections.  Sara Montoya arrived at [address] at ~ 5:45 PM and collected a specimen to be sent for culture. I printed out RXs for: Cipro 250 mg #20  1 tab bid x 10 days, pyridium 200 mg 1 tab tid x 2 days and Diflucan 150 mg 1 tablet after completing antibiotic. Start taking probiotics.  The specimen is in the [employer] refrigerator in the red bin. I have already ordered a urine culture. I encouraged her to schedule an OV within the next 2 weeks.

## 2014-12-22 ENCOUNTER — Other Ambulatory Visit: Payer: Self-pay | Admitting: Family Medicine

## 2014-12-26 ENCOUNTER — Telehealth: Payer: Self-pay

## 2014-12-26 MED ORDER — FLUOXETINE HCL 20 MG PO TABS: ORAL_TABLET | ORAL | Status: DC

## 2014-12-26 NOTE — Telephone Encounter (Signed)
Generic prozac sent to pharmacy.

## 2014-12-26 NOTE — Telephone Encounter (Signed)
Results for UC not in Epic. Called [facility]. They are faxing them to us. Dr Eden EmmsMcPherson--ok to call in generic prozac?

## 2014-12-26 NOTE — Telephone Encounter (Signed)
lmom that, Per Dr Audria NineMcPherson, pt is on Cipro, which is correct abx and to make sure she finishes all of these. Dr Katrinka BlazingSmith-- Can pt be switched over to generic prozac?

## 2014-12-26 NOTE — Telephone Encounter (Signed)
Patient is calling to get lab results and she is also calling because she would like to for us to send her prescription for generic Prozac to [facility] [address]. Patient never picked up her medication because it was too expensive and was told that we would have to call to switch it over. Please call patient for lab results. [phone]

## 2014-12-27 NOTE — Telephone Encounter (Signed)
LMOM that this was sent

## 2014-12-30 ENCOUNTER — Other Ambulatory Visit: Payer: Self-pay

## 2014-12-30 MED ORDER — FLUOXETINE HCL 20 MG PO TABS: ORAL_TABLET | ORAL | Status: DC

## 2015-01-19 ENCOUNTER — Telehealth: Payer: Self-pay | Admitting: Family Medicine

## 2015-01-19 MED ORDER — PHENAZOPYRIDINE HCL 200 MG PO TABS: 200.0000 mg | ORAL_TABLET | Freq: Three times a day (TID) | ORAL | Status: DC | PRN

## 2015-01-19 NOTE — Telephone Encounter (Signed)
Pt called on-call provider regarding dysuria, suprapubic pain. Symptoms awoke from sleep.  No fever/chills/sweats.  No vomiting.+nausea. Distended abdomen.  A/P:  Dysuria: sent in pyridium to [facility] on [address].  Recommend appointment in a.m. To evaluate urine further.  Pt to call in morning with availability for appointment.

## 2015-04-20 ENCOUNTER — Inpatient Hospital Stay (HOSPITAL_COMMUNITY): Payer: Medicaid Other

## 2015-04-20 ENCOUNTER — Encounter (HOSPITAL_COMMUNITY): Payer: Self-pay | Admitting: Medical

## 2015-04-20 ENCOUNTER — Inpatient Hospital Stay (HOSPITAL_COMMUNITY)
Admission: AD | Admit: 2015-04-20 | Discharge: 2015-04-20 | Disposition: A | Discharge status: Home / Self Care | Payer: Medicaid Other | Source: Ambulatory Visit | Attending: Family Medicine | Admitting: Family Medicine

## 2015-04-20 DIAGNOSIS — R1030 Lower abdominal pain, unspecified: Secondary | ICD-10-CM | POA: Diagnosis present

## 2015-04-20 DIAGNOSIS — O26891 Other specified pregnancy related conditions, first trimester: Secondary | ICD-10-CM | POA: Diagnosis not present

## 2015-04-20 DIAGNOSIS — Z3A08 8 weeks gestation of pregnancy: Secondary | ICD-10-CM | POA: Diagnosis not present

## 2015-04-20 DIAGNOSIS — O98811 Other maternal infectious and parasitic diseases complicating pregnancy, first trimester: Secondary | ICD-10-CM | POA: Insufficient documentation

## 2015-04-20 DIAGNOSIS — B373 Candidiasis of vulva and vagina: Secondary | ICD-10-CM | POA: Diagnosis not present

## 2015-04-20 DIAGNOSIS — R109 Unspecified abdominal pain: Secondary | ICD-10-CM | POA: Diagnosis not present

## 2015-04-20 DIAGNOSIS — Z87891 Personal history of nicotine dependence: Secondary | ICD-10-CM | POA: Diagnosis not present

## 2015-04-20 DIAGNOSIS — O9989 Other specified diseases and conditions complicating pregnancy, childbirth and the puerperium: Secondary | ICD-10-CM

## 2015-04-20 DIAGNOSIS — Z880 Allergy status to penicillin: Secondary | ICD-10-CM | POA: Diagnosis not present

## 2015-04-20 DIAGNOSIS — O26899 Other specified pregnancy related conditions, unspecified trimester: Secondary | ICD-10-CM

## 2015-04-20 LAB — CBC
HEMATOCRIT: 34.8 % — AB (ref 36.0–46.0)
Hemoglobin: 11.7 g/dL — ABNORMAL LOW (ref 12.0–15.0)
MCH: 31 pg (ref 26.0–34.0)
MCHC: 33.6 g/dL (ref 30.0–36.0)
MCV: 92.1 fL (ref 78.0–100.0)
PLATELETS: 262 10*3/uL (ref 150–400)
RBC: 3.78 MIL/uL — ABNORMAL LOW (ref 3.87–5.11)
RDW: 12.6 % (ref 11.5–15.5)
WBC: 8.1 10*3/uL (ref 4.0–10.5)

## 2015-04-20 LAB — URINALYSIS, ROUTINE W REFLEX MICROSCOPIC
BILIRUBIN URINE: NEGATIVE
GLUCOSE, UA: NEGATIVE mg/dL
HGB URINE DIPSTICK: NEGATIVE
Ketones, ur: 40 mg/dL — AB
Leukocytes, UA: NEGATIVE
Nitrite: NEGATIVE
Protein, ur: NEGATIVE mg/dL
Specific Gravity, Urine: 1.025 (ref 1.005–1.030)
UROBILINOGEN UA: 0.2 mg/dL (ref 0.0–1.0)
pH: 6 (ref 5.0–8.0)

## 2015-04-20 LAB — POCT PREGNANCY, URINE: PREG TEST UR: POSITIVE — AB

## 2015-04-20 LAB — HCG, QUANTITATIVE, PREGNANCY: hCG, Beta Chain, Quant, S: 79935 m[IU]/mL — ABNORMAL HIGH (ref <5)

## 2015-04-20 LAB — ABO/RH: ABO/RH(D): A POS

## 2015-04-20 MED ORDER — TERCONAZOLE 0.4 % VA CREA: 1.0000 | TOPICAL_CREAM | Freq: Every day | VAGINAL | Status: DC

## 2015-04-20 NOTE — Discharge Instructions (Signed)
Prenatal Care Providers °[facility]    [facility] ° Phone- [phone]     Phone: [phone] °         °[facility]                      [facility] ° @[place]     Phone: [phone] ° Phone: [phone] °        [facility] °[facility]     Phone: [phone] ° Phone: [phone]   °        [facility] °[facility] [address]                hone: [phone] ° Phone: [phone] °        [facility] °Dr. Bernard Marshall      Phone: [phone] ° Phone: [phone] °        [facility] °[facility]                Phone: [phone] ° Women’s Health  ° Phone:[phone]    [facility] °         Phone: [phone] °[facility] °  Phone: [phone] °Safe Medications in Pregnancy  ° °Acne: °Benzoyl Peroxide °Salicylic Acid ° °Backache/Headache: °Tylenol: 2 regular strength every 4 hours OR °             2 Extra strength every 6 hours ° °Colds/Coughs/Allergies: °Benadryl (alcohol free) 25 mg every 6 hours as needed °Breath right strips °Claritin °Cepacol throat lozenges °Chloraseptic throat spray °Cold-Eeze- up to three times per day °Cough drops, alcohol free °Flonase (by prescription only) °Guaifenesin °Mucinex °Robitussin DM (plain only, alcohol free) °Saline nasal spray/drops °Sudafed (pseudoephedrine) & Actifed ** use only after [redacted] weeks gestation and if you do not have high blood pressure °Tylenol °Vicks Vaporub °Zinc lozenges °Zyrtec  ° °Constipation: °Colace °Ducolax suppositories °Fleet enema °Glycerin suppositories °Metamucil °Milk of magnesia °Miralax °Senokot °Smooth move tea ° °Diarrhea: °Kaopectate °Imodium A-D ° °*NO pepto Bismol ° °Hemorrhoids: °Anusol °Anusol HC °Preparation H °Tucks ° °Indigestion: °Tums °Maalox °Mylanta °Zantac  °Pepcid ° °Insomnia: °Benadryl (alcohol free) 25mg every 6 hours as needed °Tylenol  PM °Unisom, no Gelcaps ° °Leg Cramps: °Tums °MagGel ° °Nausea/Vomiting:  °Bonine °Dramamine °Emetrol °Ginger extract °Sea bands °Meclizine  °Nausea medication to take during pregnancy:  °Unisom (doxylamine succinate 25 mg tablets) Take one tablet daily at bedtime. If symptoms are not adequately controlled, the dose can be increased to a maximum recommended dose of two tablets daily (1/2 tablet in the morning, 1/2 tablet mid-afternoon and one at bedtime). °Vitamin B6 100mg tablets. Take one tablet twice a day (up to 200 mg per day). ° °Skin Rashes: °Aveeno products °Benadryl cream or 25mg every 6 hours as needed °Calamine Lotion °1% cortisone cream ° °Yeast infection: °Gyne-lotrimin 7 °Monistat 7 ° ° °**If taking multiple medications, please check labels to avoid duplicating the same active ingredients °**take medication as directed on the label °** Do not exceed 4000 mg of tylenol in 24 hours °**Do not take medications that contain aspirin or ibuprofen ° ° ° ° °

## 2015-04-20 NOTE — MAU Provider Note (Signed)
History     CSN: [id number]  Arrival date and time: 04/20/15 8841   First Provider Initiated Contact with Patient 04/20/15 2019      Chief Complaint  Patient presents with  . Abdominal Pain   HPI [name] is a 39 y.o. G1P0 at [redacted]w[redacted]d who presents to MAU today with complaint of lower abdominal cramping since last week. The patient went to [facility] on Friday with complaint of lower abdominal pain and was treated with Macrobid for a UTI. She was also given Rx for cream for yeast vulvovaginitis which she has not started taking yet. She states that she had +UPT at [facility] on Friday. The pain has continued. She states mild to moderate pain. She denies taking pain medication. She continues to have vaginal discharge and dark urine. She denies fever or vaginal bleeding.   OB History    Gravida Para Term Preterm AB TAB SAB Ectopic Multiple Living   Past Medical History  Diagnosis Date  . Vaginal discharge     chronic  . Cervical dysplasia     ASCUS, s/p colposcopy 2005, C1N1  . Depression     onset age [age]; menstrually related.  . Thyroid disease 01/29/2014    s/p endocrinology evaluation.  . Eczema     [place]/dermatology.    Past Surgical History  Procedure Laterality Date  . Wisdom tooth extraction  1999  . Cesarean section  2000    Family History  Problem Relation Age of Onset  . Depression Mother   . Hypertension Mother   . Hyperlipidemia Mother   . Heart disease Neg Hx   . Cancer Father     prostate cancer  . Hyperlipidemia Father   . Thyroid disease Paternal Grandmother     Social History  Substance Use Topics  . Smoking status: Former Smoker -- 3.30 packs/day for 5 years    Types: Cigarettes  . Smokeless tobacco: None  . Alcohol Use: No    Allergies:  Allergies  Allergen Reactions  . Codeine Hives  . Penicillins Hives    Has patient had a PCN reaction causing immediate rash, facial/tongue/throat swelling, SOB or lightheadedness  with hypotension: No Has patient had a PCN reaction causing severe rash involving mucus membranes or skin necrosis: No Has patient had a PCN reaction that required hospitalization No Has patient had a PCN reaction occurring within the last 10 years: No If all of the above answers are "NO", then may proceed with Cephalosporin use.   . Sulfamethoxazole-Trimethoprim Other (See Comments)    REACTION: bumps under skin    Prescriptions prior to admission  Medication Sig Dispense Refill Last Dose  . Biotin 5000 MCG TABS Take 1 tablet by mouth 2 (two) times daily.   Past Week at Unknown time  . nitrofurantoin, macrocrystal-monohydrate, (MACROBID) 100 MG capsule Take 100 mg by mouth 2 (two) times daily. For seven days.   04/19/2015 at Unknown time  . ciprofloxacin (CIPRO) 250 MG tablet Take 1 tablet (250 mg total) by mouth 2 (two) times daily. (Patient not taking: Reported on 04/20/2015) 20 tablet 0 Completed Course at Unknown time  . citalopram (CELEXA) 20 MG tablet Take 1 tablet (20 mg total) by mouth daily. (Patient not taking: Reported on 04/20/2015) 30 tablet 3 Not Taking at Unknown time  . fluconazole (DIFLUCAN) 150 MG tablet Take 1 tablet (150 mg total) by mouth once. (Patient not  taking: Reported on 04/20/2015) 1 tablet 0 Completed Course at Unknown time  . FLUoxetine (PROZAC) 20 MG tablet 1/2 tablet daily for two weeks and then increase to one tablet daily. (Patient not taking: Reported on 04/20/2015) 30 tablet 3 Not Taking at Unknown time  . phenazopyridine (PYRIDIUM) 200 MG tablet Take 1 tablet (200 mg total) by mouth 3 (three) times daily as needed for pain. (Patient not taking: Reported on 04/20/2015) 6 tablet 0 Completed Course at Unknown time    Review of Systems  Constitutional: Negative for fever and malaise/fatigue.  Gastrointestinal: Positive for abdominal pain. Negative for nausea, vomiting, diarrhea and constipation.  Genitourinary: Negative for dysuria, urgency and frequency.        Neg - vaginal bleeding + vaginal discharge   Physical Exam   Blood pressure 105/66, pulse 81, temperature 98.3 F (36.8 C), temperature source Oral, resp. rate 18, height  (1.499 m), weight 52.799 kg (116 lb 6.4 oz), last menstrual period 02/23/2015.  Physical Exam  Nursing note and vitals reviewed. Constitutional: She is oriented to person, place, and time. She appears well-developed and well-nourished. No distress.  HENT:  Head: Normocephalic and atraumatic.  Cardiovascular: Normal rate.   Respiratory: Effort normal.  GI: Soft. She exhibits no distension and no mass. There is no tenderness. There is no rebound and no guarding.  Neurological: She is alert and oriented to person, place, and time.  Skin: Skin is warm and dry. No erythema.  Psychiatric: She has a normal mood and affect.   Results for orders placed or performed during the hospital encounter of 04/20/15 (from the past 24 hour(s))  Urinalysis, Routine w reflex microscopic (not at [facility])     Status: Abnormal   Collection Time: 04/20/15  7:45 PM  Result Value Ref Range   Color, Urine YELLOW YELLOW   APPearance CLEAR CLEAR   Specific Gravity, Urine 1.025 1.005 - 1.030   pH 6.0 5.0 - 8.0   Glucose, UA NEGATIVE NEGATIVE mg/dL   Hgb urine dipstick NEGATIVE NEGATIVE   Bilirubin Urine NEGATIVE NEGATIVE   Ketones, ur 40 (A) NEGATIVE mg/dL   Protein, ur NEGATIVE NEGATIVE mg/dL   Urobilinogen, UA 0.2 0.0 - 1.0 mg/dL   Nitrite NEGATIVE NEGATIVE   Leukocytes, UA NEGATIVE NEGATIVE  Pregnancy, urine POC     Status: Abnormal   Collection Time: 04/20/15  7:59 PM  Result Value Ref Range   Preg Test, Ur POSITIVE (A) NEGATIVE  CBC     Status: Abnormal   Collection Time: 04/20/15  8:34 PM  Result Value Ref Range   WBC 8.1 4.0 - 10.5 K/uL   RBC 3.78 (L) 3.87 - 5.11 MIL/uL   Hemoglobin 11.7 (L) 12.0 - 15.0 g/dL   HCT 81.1 (L) 91.4 - 78.2 %   MCV 92.1 78.0 - 100.0 fL   MCH 31.0 26.0 - 34.0 pg   MCHC 33.6 30.0 - 36.0 g/dL    RDW 95.6 21.3 - 08.6 %   Platelets 262 150 - 400 K/uL  hCG, quantitative, pregnancy     Status: Abnormal   Collection Time: 04/20/15  8:34 PM  Result Value Ref Range   hCG, Beta Chain, Quant, S 57846 (H) <5 mIU/mL  ABO/Rh     Status: None (Preliminary result)   Collection Time: 04/20/15  8:34 PM  Result Value Ref Range   ABO/RH(D) A POS    US Ob Comp Less 14 Wks  04/20/2015   CLINICAL DATA:  39 year old pregnant female with  acute pelvic pain today. Estimated gestational age of [redacted] weeks 0 days by LMP.  EXAM: OBSTETRIC <14 WK ULTRASOUND  TECHNIQUE: Transabdominal ultrasound was performed for evaluation of the gestation as well as the maternal uterus and adnexal regions.  COMPARISON:  None.  FINDINGS: Intrauterine gestational sac: Visualized/normal in shape.  Yolk sac:  Visualized  Embryo:  Visualized  Cardiac Activity: Visualized  Heart Rate: 171  bpm  CRL:  49.3  mm   11 w   5 d                  [place] EDC: 11/04/2015  Maternal uterus/adnexae: There is no evidence of subchorionic hemorrhage.  The ovaries bilaterally are unremarkable.  There is no evidence of free fluid or adnexal mass.  IMPRESSION: Single living intrauterine gestation with estimated gestational age of [redacted] weeks 5 days by this ultrasound. No evidence of subchorionic hemorrhage.   Electronically Signed   By: Harmon Pier M.D.   On: 04/20/2015 22:17    MAU Course  Procedures None  MDM +UPT UA, CBC, ABO/Rh, quant hCG and [place] today Patient has a lot of questions about prenatal vitamins and treatment options for yeast infection.  Advised that Rx for prenatal vitamins will be expensive without insurance and that OTC options were safe. She is unsure about OTC options and is worried about taking them.  Patient also asks if there is a treatment for yeast that is not a vaginal cream. Advised that Diflucan is not recommended in pregnancy due to possible risk of birth defects.  2117 - patient in [place]. Care turned over to [facility], CNM  Marny Lowenstein, PA-C  04/20/2015, 9:16 PM   Assessment and Plan   1. Abdominal pain in pregnancy, antepartum    DC home Comfort measures reviewed  2ndTrimester precautions  Bleeding precautions Return to MAU as needed FU with OB as planned  Follow-up Information    Follow up with [facility] [place].   Contact information:   [address] [place] [address] [phone]

## 2015-04-20 NOTE — MAU Note (Signed)
Pt presents complaining of lower abdominal pain that started today. LMP July 25.  +HPT Friday. Denies vaginal bleeding. Currently being treated for UTI and yeast infection.

## 2015-04-26 ENCOUNTER — Encounter (HOSPITAL_COMMUNITY): Payer: Self-pay | Admitting: *Deleted

## 2015-04-26 ENCOUNTER — Inpatient Hospital Stay (HOSPITAL_COMMUNITY)
Admission: AD | Admit: 2015-04-26 | Discharge: 2015-04-26 | Disposition: A | Discharge status: Home / Self Care | Payer: Medicaid Other | Source: Ambulatory Visit | Attending: Obstetrics & Gynecology | Admitting: Obstetrics & Gynecology

## 2015-04-26 DIAGNOSIS — O98811 Other maternal infectious and parasitic diseases complicating pregnancy, first trimester: Secondary | ICD-10-CM | POA: Diagnosis not present

## 2015-04-26 DIAGNOSIS — Z87891 Personal history of nicotine dependence: Secondary | ICD-10-CM | POA: Diagnosis not present

## 2015-04-26 DIAGNOSIS — R109 Unspecified abdominal pain: Secondary | ICD-10-CM | POA: Insufficient documentation

## 2015-04-26 DIAGNOSIS — B373 Candidiasis of vulva and vagina: Secondary | ICD-10-CM

## 2015-04-26 DIAGNOSIS — R1030 Lower abdominal pain, unspecified: Secondary | ICD-10-CM

## 2015-04-26 DIAGNOSIS — Z880 Allergy status to penicillin: Secondary | ICD-10-CM | POA: Diagnosis not present

## 2015-04-26 DIAGNOSIS — O26899 Other specified pregnancy related conditions, unspecified trimester: Secondary | ICD-10-CM

## 2015-04-26 DIAGNOSIS — R220 Localized swelling, mass and lump, head: Secondary | ICD-10-CM

## 2015-04-26 DIAGNOSIS — R102 Pelvic and perineal pain unspecified side: Secondary | ICD-10-CM

## 2015-04-26 DIAGNOSIS — B3731 Acute candidiasis of vulva and vagina: Secondary | ICD-10-CM

## 2015-04-26 DIAGNOSIS — Z3A12 12 weeks gestation of pregnancy: Secondary | ICD-10-CM | POA: Diagnosis not present

## 2015-04-26 DIAGNOSIS — N949 Unspecified condition associated with female genital organs and menstrual cycle: Secondary | ICD-10-CM

## 2015-04-26 DIAGNOSIS — O9989 Other specified diseases and conditions complicating pregnancy, childbirth and the puerperium: Secondary | ICD-10-CM | POA: Diagnosis not present

## 2015-04-26 HISTORY — DX: Urinary tract infection, site not specified: N39.0

## 2015-04-26 LAB — URINALYSIS, ROUTINE W REFLEX MICROSCOPIC
BILIRUBIN URINE: NEGATIVE
Glucose, UA: NEGATIVE mg/dL
Ketones, ur: 40 mg/dL — AB
Leukocytes, UA: NEGATIVE
Nitrite: NEGATIVE
PH: 5.5 (ref 5.0–8.0)
Protein, ur: NEGATIVE mg/dL
SPECIFIC GRAVITY, URINE: 1.02 (ref 1.005–1.030)
UROBILINOGEN UA: 0.2 mg/dL (ref 0.0–1.0)

## 2015-04-26 LAB — URINE MICROSCOPIC-ADD ON

## 2015-04-26 LAB — WET PREP, GENITAL
CLUE CELLS WET PREP: NONE SEEN
TRICH WET PREP: NONE SEEN
YEAST WET PREP: NONE SEEN

## 2015-04-26 MED ORDER — IBUPROFEN 600 MG PO TABS
600.0000 mg | ORAL_TABLET | Freq: Four times a day (QID) | ORAL | Status: DC | PRN
Start: 2015-04-26 — End: 2015-05-11

## 2015-04-26 MED ORDER — PREDNISONE 50 MG PO TABS
60.0000 mg | ORAL_TABLET | Freq: Once | ORAL | Status: DC
  Filled 2015-04-26: qty 1

## 2015-04-26 MED ORDER — PREDNISONE 10 MG (21) PO TBPK: 10.0000 mg | ORAL_TABLET | Freq: Four times a day (QID) | ORAL | Status: DC

## 2015-04-26 MED ORDER — IBUPROFEN 600 MG PO TABS
600.0000 mg | ORAL_TABLET | Freq: Once | ORAL | Status: AC
  Administered 2015-04-26: 600 mg via ORAL
  Filled 2015-04-26: qty 1

## 2015-04-26 NOTE — MAU Note (Signed)
Urine in lab 

## 2015-04-26 NOTE — MAU Note (Addendum)
Pt started taking macrobid last Friday, had no problems until last night, pt states her face started hurting some but she was able to sleep.  Woke up at 5 this a.m. In severe pain with swelling bilaterally.  Hurts to open her mouth.  Also has pain in R ear.  Has pressure on both sides of face.  Hurts to swallow, tongue hurts.

## 2015-04-26 NOTE — Discharge Instructions (Signed)
Although it is unlikely that Macrobid caused a delayed allergic reaction you should inform your doctor of this allergic reaction and consider avoiding Mactrobid in the future.    Allergies Allergies may happen from anything your body is sensitive to. This may be food, medicines, pollens, chemicals, and nearly anything around you in everyday life that produces allergens. An allergen is anything that causes an allergy producing substance. Heredity is often a factor in causing these problems. This means you may have some of the same allergies as your parents. Food allergies happen in all age groups. Food allergies are some of the most severe and life threatening. Some common food allergies are cow's milk, seafood, eggs, nuts, wheat, and soybeans. SYMPTOMS   Swelling around the mouth.  An itchy red rash or hives.  Vomiting or diarrhea.  Difficulty breathing. SEVERE ALLERGIC REACTIONS ARE LIFE-THREATENING. This reaction is called anaphylaxis. It can cause the mouth and throat to swell and cause difficulty with breathing and swallowing. In severe reactions only a trace amount of food (for example, peanut oil in a salad) may cause death within seconds. Seasonal allergies occur in all age groups. These are seasonal because they usually occur during the same season every year. They may be a reaction to molds, grass pollens, or tree pollens. Other causes of problems are house dust mite allergens, pet dander, and mold spores. The symptoms often consist of nasal congestion, a runny itchy nose associated with sneezing, and tearing itchy eyes. There is often an associated itching of the mouth and ears. The problems happen when you come in contact with pollens and other allergens. Allergens are the particles in the air that the body reacts to with an allergic reaction. This causes you to release allergic antibodies. Through a chain of events, these eventually cause you to release histamine into the blood stream.  Although it is meant to be protective to the body, it is this release that causes your discomfort. This is why you were given anti-histamines to feel better. If you are unable to pinpoint the offending allergen, it may be determined by skin or blood testing. Allergies cannot be cured but can be controlled with medicine. Hay fever is a collection of all or some of the seasonal allergy problems. It may often be treated with simple over-the-counter medicine such as diphenhydramine. Take medicine as directed. Do not drink alcohol or drive while taking this medicine. Check with your caregiver or package insert for child dosages. If these medicines are not effective, there are many new medicines your caregiver can prescribe. Stronger medicine such as nasal spray, eye drops, and corticosteroids may be used if the first things you try do not work well. Other treatments such as immunotherapy or desensitizing injections can be used if all else fails. Follow up with your caregiver if problems continue. These seasonal allergies are usually not life threatening. They are generally more of a nuisance that can often be handled using medicine. HOME CARE INSTRUCTIONS   If unsure what causes a reaction, keep a diary of foods eaten and symptoms that follow. Avoid foods that cause reactions.  If hives or rash are present:  Take medicine as directed.  You may use an over-the-counter antihistamine (diphenhydramine/Benadryl) for hives and itching as needed.  Apply cold compresses (cloths) to the skin or take baths in cool water. Avoid hot baths or showers. Heat will make a rash and itching worse.  If you are severely allergic:  Following a treatment for a severe reaction,  hospitalization is often required for closer follow-up.  Wear a medic-alert bracelet or necklace stating the allergy.  You and your family must learn how to give adrenaline or use an anaphylaxis kit.  If you have had a severe reaction, always  carry your anaphylaxis kit or EpiPen with you. Use this medicine as directed by your caregiver if a severe reaction is occurring. Failure to do so could have a fatal outcome. SEEK MEDICAL CARE IF:  You suspect a food allergy. Symptoms generally happen within 30 minutes of eating a food.  Your symptoms have not gone away within 2 days or are getting worse.  You develop new symptoms.  You want to retest yourself or your child with a food or drink you think causes an allergic reaction. Never do this if an anaphylactic reaction to that food or drink has happened before. Only do this under the care of a caregiver. SEEK IMMEDIATE MEDICAL CARE IF:   You have difficulty breathing, are wheezing, or have a tight feeling in your chest or throat.  You have a swollen mouth, or you have hives, swelling, or itching all over your body.  You have had a severe reaction that has responded to your anaphylaxis kit or an EpiPen. These reactions may return when the medicine has worn off. These reactions should be considered life threatening. MAKE SURE YOU:   Understand these instructions.  Will watch your condition.  Will get help right away if you are not doing well or get worse. Document Released: 10/11/2002 Document Revised: 11/12/2012 Document Reviewed: 03/17/2008 [facility] Patient Information 2015 [place], [place]. This information is not intended to replace advice given to you by your health care provider. Make sure you discuss any questions you have with your health care provider.  Round Ligament Pain During Pregnancy   Round ligament pain is a sharp pain or jabbing feeling often felt in the lower belly or groin area on one or both sides. It is one of the most common complaints during pregnancy and is considered a normal part of pregnancy. It is most often felt during the second trimester.   Here is what you need to know about round ligament pain, including some tips to help you feel better.    Causes of Round Ligament Pain   Several thick ligaments surround and support your womb (uterus) as it grows during pregnancy. One of them is called the round ligament.   The round ligament connects the front part of the womb to your groin, the area where your legs attach to your pelvis. The round ligament normally tightens and relaxes slowly.   As your baby and womb grow, the round ligament stretches. That makes it more likely to become strained.   Sudden movements can cause the ligament to tighten quickly, like a rubber band snapping. This causes a sudden and quick jabbing feeling.   Symptoms of Round Ligament Pain   Round ligament pain can be concerning and uncomfortable. But it is considered normal as your body changes during pregnancy.   The symptoms of round ligament pain include a sharp, sudden spasm in the belly. It usually affects the right side, but it may happen on both sides. The pain only lasts a few seconds.   Exercise may cause the pain, as will rapid movements such as:  sneezing  coughing  laughing  rolling over in bed  standing up too quickly   Treatment of Round Ligament Pain   Here are some tips that may help  reduce your discomfort:   Pain relief. Take over-the-counter acetaminophen for pain, if necessary. Ask your doctor if this is OK.   Exercise. Get plenty of exercise to keep your stomach (core) muscles strong. Doing stretching exercises or prenatal yoga can be helpful. Ask your doctor which exercises are safe for you and your baby.   A helpful exercise involves putting your hands and knees on the floor, lowering your head, and pushing your backside into the air.   Avoid sudden movements. Change positions slowly (such as standing up or sitting down) to avoid sudden movements that may cause stretching and pain.   Flex your hips. Bend and flex your hips before you cough, sneeze, or laugh to avoid pulling on the ligaments.   Apply warmth. A heating pad or  warm bath may be helpful. Ask your doctor if this is OK. Extreme heat can be dangerous to the baby.   You should try to modify your daily activity level and avoid positions that may worsen the condition.   When to Call the Doctor/Midwife   Always tell your doctor or midwife about any type of pain you have during pregnancy. Round ligament pain is quick and doesn't last long.   Call your health care provider immediately if you have:  severe pain  fever  chills  pain on urination  difficulty walking   Belly pain during pregnancy can be due to many different causes. It is important for your doctor to rule out more serious conditions, including pregnancy complications such as placenta abruption or non-pregnancy illnesses such as:  inguinal hernia  appendicitis  stomach, liver, and kidney problems  Preterm labor pains may sometimes be mistaken for round ligament pain.

## 2015-04-26 NOTE — MAU Provider Note (Signed)
Chief Complaint: Facial Swelling   First Provider Initiated Contact with Patient 04/26/15 [phone]     SUBJECTIVE HPI: Sara Montoya is a 39 y.o. G2P1000 at 103w4d who presents to Maternity Admissions reporting waking up with bilateral jaw swelling. Also reports continued low abdominal pain and vaginal discharge for which she was evaluated on 04/20/2015 in maternity admissions. Ultrasound showed live single intrauterine pregnancy.  Pt started taking macrobid last Friday, had no problems until last night, pt states her face started hurting some but she was able to sleep. Woke up at 5 this a.m. In severe pain with swelling bilaterally. Hurts to open her mouth. Also has pain in R ear. Has pressure on both sides of face. Hurts to swallow, tongue hurts  Swelling Location: Bilateral jaws Duration: Less than 24 hours Context: On recent new medication was Macrobid taken from 04/17/2015 until 04/23/2015. Denies changes in food or cosmetics. Timing: Constant since 5 AM Modifying factors: None. Hasn't tried anything for symptoms.  Associated signs and symptoms: Positive for facial pressure, right ear pressure and tongue pain. Negative for swelling of tongue or lips, shortness of breath, difficulty breathing, nausea, vomiting, rash or hives.  Abdominal pain Location: Bilateral groin Quality: Sharp Severity: Moderate when it occurs. None now. Duration: Less than 1 minute Context: With movement, getting out of bed Timing: Intermittent Modifying factors: Worse with movement. Better with rest. Hasn't tried anything for the pain. Associated signs and symptoms: Positive for vaginal discharge. Recently diagnosed with vaginal yeast infection and prescribed Terazol, the patient is vague about whether she is actually using Terazol. Negative for fever, chills, dysuria, urgency, frequency, hematuria, vaginal bleeding, flank pain, nausea, vomiting, diarrhea or constipation.  Past Medical History  Diagnosis  Date  . Vaginal discharge     chronic  . Cervical dysplasia     ASCUS, s/p colposcopy 2005, C1N1  . Depression     onset age teenager; menstrually related.  . Eczema     [place]/dermatology.  Marland Kitchen UTI (lower urinary tract infection)    OB History  Gravida Para Term Preterm AB SAB TAB Ectopic Multiple Living  # Outcome Date GA Lbr Len/2nd Weight Sex Delivery Anes PTL Lv  2 Current           1 Term              Past Surgical History  Procedure Laterality Date  . Wisdom tooth extraction  1999  . Cesarean section  2000   Social History   Social History  . Marital Status: Single    Spouse Name: N/A  . Number of Children: N/A  . Years of Education: N/A   Occupational History  . Not on file.   Social History Main Topics  . Smoking status: Former Smoker -- 3.30 packs/day for 5 years    Types: Cigarettes  . Smokeless tobacco: Not on file  . Alcohol Use: No  . Drug Use: No  . Sexual Activity: Yes   Other Topics Concern  . Not on file   Social History Narrative   Financial assistance approved for 100% discount at [facility] and has [facility] card per Rudell Cobb   07/09/2010      Marital status: single; not dating currently      Children:  1 child (15)      Lives: with daughter.      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  Tobacco: none      Alcohol: 4 times per week; 3 glasses wine      Drugs: none      Exercise: walking at work.      No current facility-administered medications on file prior to encounter.   Current Outpatient Prescriptions on File Prior to Encounter  Medication Sig Dispense Refill  . nitrofurantoin, macrocrystal-monohydrate, (MACROBID) 100 MG capsule Take 100 mg by mouth 2 (two) times daily. For seven days.    Marland Kitchen terconazole (TERAZOL 7) 0.4 % vaginal cream Place 1 applicator vaginally at bedtime. 45 g 0   Allergies  Allergen Reactions  . Codeine Hives  . Penicillins Hives    Has patient had a PCN reaction  causing immediate rash, facial/tongue/throat swelling, SOB or lightheadedness with hypotension: No Has patient had a PCN reaction causing severe rash involving mucus membranes or skin necrosis: No Has patient had a PCN reaction that required hospitalization No Has patient had a PCN reaction occurring within the last 10 years: No If all of the above answers are "NO", then may proceed with Cephalosporin use.   . Sulfamethoxazole-Trimethoprim Other (See Comments)    REACTION: bumps under skin    I have reviewed the past Medical Hx, Surgical Hx, Social Hx, Allergies and Medications.   Review of Systems  OBJECTIVE Patient Vitals for the past 24 hrs:  BP Temp Temp src Pulse Resp Height Weight  04/26/15 0758 113/67 mmHg 98 F (36.7 C) Oral 104 18 - -  04/26/15 0754 - - - - -  (1.499 m) 115 lb 2 oz (52.22 kg)   Constitutional: Well-developed, well-nourished female in mild distress.  Psych: Anxious, occasionally tearful. Tangential speech. Head: Significant swelling in bilateral jaws and neck below jawline. No swelling of lips or tongue. Limited range of motion of jaws the patient attributes to pressure. Swollen areas feel firm, nonfluctuant, nontender. ENT: Right ear canal edematous but without erythema or rash. Bilateral tympanic membranes without erythema or effusion. Bony landmarks visible. Skin: No rash or hives. Face has extensive acne scarring. Cardiovascular: Regular rate and rhythm. No murmurs rubs or gallops. Respiratory: normal rate and effort. Clear to auscultation bilaterally. GI: Abd soft, non-tender, gravid appropriate for gestational age.  MS: Extremities nontender, no edema, normal ROM Neurologic: Alert and oriented x 4.  GU: Neg CVAT.  SPECULUM EXAM: NEFG, small-moderate amount of thick, white, odorless discharge, no blood noted, cervix clean  BIMANUAL: cervix long and closed; uterus 14-week size, no adnexal tenderness or masses. No CMT.  Fetal heart rate 161 by  Doppler.  LAB RESULTS Results for orders placed or performed during the hospital encounter of 04/26/15 (from the past 24 hour(s))  Urinalysis, Routine w reflex microscopic (not at [facility])     Status: Abnormal   Collection Time: 04/26/15  7:45 AM  Result Value Ref Range   Color, Urine YELLOW YELLOW   APPearance CLEAR CLEAR   Specific Gravity, Urine 1.020 1.005 - 1.030   pH 5.5 5.0 - 8.0   Glucose, UA NEGATIVE NEGATIVE mg/dL   Hgb urine dipstick TRACE (A) NEGATIVE   Bilirubin Urine NEGATIVE NEGATIVE   Ketones, ur 40 (A) NEGATIVE mg/dL   Protein, ur NEGATIVE NEGATIVE mg/dL   Urobilinogen, UA 0.2 0.0 - 1.0 mg/dL   Nitrite NEGATIVE NEGATIVE   Leukocytes, UA NEGATIVE NEGATIVE  Urine microscopic-add on     Status: Abnormal   Collection Time: 04/26/15  7:45 AM  Result Value Ref Range   Squamous Epithelial / LPF FEW (  A) RARE   WBC, UA 3-6 <3 WBC/hpf   RBC / HPF 0-2 <3 RBC/hpf   Bacteria, UA FEW (A) RARE   Urine-Other MUCOUS PRESENT   Wet prep, genital     Status: Abnormal   Collection Time: 04/26/15  9:05 AM  Result Value Ref Range   Yeast Wet Prep HPF POC NONE SEEN NONE SEEN   Trich, Wet Prep NONE SEEN NONE SEEN   Clue Cells Wet Prep HPF POC NONE SEEN NONE SEEN   WBC, Wet Prep HPF POC MODERATE (A) NONE SEEN    IMAGING N/A  MAU COURSE UA, wet prep, GC/chlamydia cultures. Offered patient Benadryl for what appears to be in allergic reaction. Patient declines due to not having a ride and concern about safety of Benadryl in pregnancy. CNM suggested that patient be evaluated at primary care office or urgent care since maternity admissions specializes in obstetrics and gynecology and is not best place for management of her facial swelling. Patient refused due to financial concerns. Discussed patient's symptoms and exam with Dr. Lowry Ram who recommends prednisone Dosepak. Patient also refused prednisone due to concerns in pregnancy. CNM discussed risks versus benefits of medicines to treat  her condition in pregnancy. Reassured her that these medications are relatively safe at this point in the pregnancy and that she should weigh her concerns about the safety of medications against the amount of discomfort and distress that she is in at this time. Patient still refuses, became very tearful because she is in pain from the swelling. Offered ibuprofen and cold compress.   There appears to be a strong psych component to patient's response to her facial swelling that is impairing her ability to make decisions about treatment. CNM and RN repeated treatment options of prednisone, Benadryl, ibuprofen, cold compresses and/or expectant management. CNM reiterated that this problem would be best managed by primary care provider/urgent care.    MDM -39 year old female 12 weeks 4 days gestation with swelling that appears to be from an allergic reaction. No evidence of anaphylaxis or angioedema (as this swelling spares the mouth area). Uncertain what triggered this reaction. Discussed with pharmacist what types of allergic reactions are seen with Macrobid. The type and delayed-onset of the swelling do not fit what is often seen with Macrobid. Will still recommend the patient avoid Macrobid in the future to be cautious. -Abdominal pain in pregnancy likely due to round ligament pain. No pregnancy-related emergencies evident. - Vaginal discharge in pregnancy consistent with yeast infection based on exam. Encouraged patient to complete course of Terazol or may manage expectantly if symptoms are not bothersome to her. GC/culture pending.   ASSESSMENT 1. Facial swelling   2. Vaginal yeast infection   3. Pregnancy related abdominal pain of lower quadrant, antepartum   4. Pain of round ligament affecting pregnancy, antepartum    PLAN Discharge home in stable condition. Anaphylaxis Precautions Prescribed low-dose steroidal taper to increase patient's comfort with taking it.  Ibuprofen and Benadryl when  necessary. GC/Chlamydia cultures pending Comfort measures for mammogram and discussed. Follow-up Information    Follow up with [facility] On 05/05/2015.   Specialty:  Obstetrics and Gynecology   Why:  Start prenatal care   Contact information:   [address] [place] [address] [phone]       Follow up with Primary care provider.   Why:  As needed if no improvement in 2-3 days      Follow up with [facility].  Why:  As needed if symptoms worsen (Difficuty breathing, swelling of toungue, lips, throat)   Contact information:   [address] [place] [address]         Medication List    STOP taking these medications        nitrofurantoin (macrocrystal-monohydrate) 100 MG capsule  Commonly known as:  MACROBID      TAKE these medications        ibuprofen 600 MG tablet  Commonly known as:  ADVIL,MOTRIN  Take 1 tablet (600 mg total) by mouth every 6 (six) hours as needed.     predniSONE 10 MG (21) Tbpk tablet  Commonly known as:  STERAPRED UNI-PAK 21 TAB  Take 1 tablet (10 mg total) by mouth taper from 4 doses each day to 1 dose and stop. Day 1: 10/12/03/08 Day 2: 5/12/03/08 Day 3: 5/5/5/5 Day 4: 5/5/5 Day 5: 5/5 Day 6: 5     prenatal multivitamin Tabs tablet  Take 1 tablet by mouth daily at 12 noon.     terconazole 0.4 % vaginal cream  Commonly known as:  TERAZOL 7  Place 1 applicator vaginally at bedtime.       [place], CNM 04/26/2015  9:54 AM

## 2015-04-26 NOTE — MAU Note (Signed)
Pt states here for facial swelling. Taking abx for uti, and has been taking for a week. Painful to open mouth. Jaw swollen bilaterally, however moreso on her r side.

## 2015-04-26 NOTE — MAU Note (Signed)
Pt called me into room, is crying, states her pain is severe.  States she is unable to take anything "strong" because she is driving herself & doesn't have anyone to pick her up.  Is not sure what she is willing to take for pain.  CNM informed.

## 2015-04-27 ENCOUNTER — Telehealth: Payer: Self-pay

## 2015-04-27 LAB — CULTURE, OB URINE
Culture: NO GROWTH
SPECIAL REQUESTS: NORMAL

## 2015-04-27 LAB — GC/CHLAMYDIA PROBE AMP (~~LOC~~) NOT AT ARMC
CHLAMYDIA, DNA PROBE: NEGATIVE
Neisseria Gonorrhea: NEGATIVE

## 2015-04-27 NOTE — Telephone Encounter (Signed)
Patient can have a tb placed here but she needs an office visit. Otherwise if [facility] has PPDs, they can place it themselves and have a Helix Lafontaine there write the order.

## 2015-04-27 NOTE — Telephone Encounter (Signed)
Arielle from [facility] is calling because the patient is pregnant and needs a TB. Ariell wants to know if the patient needs a written order in order to get the TB done since patient is pregnant.

## 2015-04-28 NOTE — Telephone Encounter (Signed)
Tried to call the office but the phone lines were off because they were on lunch

## 2015-04-29 NOTE — Telephone Encounter (Signed)
OK to double book with me on Friday at 104.  Pt must be charged a minimal OV by our office to cover the administration and read of the Tb test.  Pt's other option is to present to the health department.

## 2015-04-29 NOTE — Telephone Encounter (Signed)
Pt request Dr. Katrinka Blazing write an order to prevent OV charge since she does not have insurance.

## 2015-04-29 NOTE — Telephone Encounter (Signed)
Spoke with [facility] and relayed message about the TB test. I will call pt to advise.

## 2015-04-29 NOTE — Telephone Encounter (Signed)
Patient is returning a missed phone call for Sara Montoya °

## 2015-04-30 NOTE — Telephone Encounter (Signed)
Patient doesn't want to do an OV because we don't take pregnancy medicaid and it would cost too much. Please advise!

## 2015-05-07 LAB — OB RESULTS CONSOLE ANTIBODY SCREEN: ANTIBODY SCREEN: NEGATIVE

## 2015-05-07 LAB — OB RESULTS CONSOLE HEPATITIS B SURFACE ANTIGEN: HEP B S AG: NEGATIVE

## 2015-05-07 LAB — OB RESULTS CONSOLE HIV ANTIBODY (ROUTINE TESTING): HIV: NONREACTIVE

## 2015-05-07 LAB — OB RESULTS CONSOLE ABO/RH: RH TYPE: POSITIVE

## 2015-05-07 LAB — OB RESULTS CONSOLE RUBELLA ANTIBODY, IGM: RUBELLA: IMMUNE

## 2015-05-07 LAB — OB RESULTS CONSOLE RPR: RPR: NONREACTIVE

## 2015-05-10 ENCOUNTER — Inpatient Hospital Stay (HOSPITAL_COMMUNITY)
Admission: AD | Admit: 2015-05-10 | Discharge: 2015-05-11 | Disposition: A | Discharge status: Home / Self Care | Payer: Medicaid Other | Source: Ambulatory Visit | Attending: Obstetrics & Gynecology | Admitting: Obstetrics & Gynecology

## 2015-05-10 ENCOUNTER — Encounter (HOSPITAL_COMMUNITY): Payer: Self-pay

## 2015-05-10 DIAGNOSIS — O9989 Other specified diseases and conditions complicating pregnancy, childbirth and the puerperium: Secondary | ICD-10-CM | POA: Diagnosis not present

## 2015-05-10 DIAGNOSIS — Z880 Allergy status to penicillin: Secondary | ICD-10-CM | POA: Insufficient documentation

## 2015-05-10 DIAGNOSIS — N898 Other specified noninflammatory disorders of vagina: Secondary | ICD-10-CM | POA: Insufficient documentation

## 2015-05-10 DIAGNOSIS — K589 Irritable bowel syndrome without diarrhea: Secondary | ICD-10-CM | POA: Diagnosis not present

## 2015-05-10 DIAGNOSIS — O99612 Diseases of the digestive system complicating pregnancy, second trimester: Secondary | ICD-10-CM | POA: Diagnosis not present

## 2015-05-10 DIAGNOSIS — Z3A14 14 weeks gestation of pregnancy: Secondary | ICD-10-CM | POA: Diagnosis not present

## 2015-05-10 DIAGNOSIS — R109 Unspecified abdominal pain: Secondary | ICD-10-CM | POA: Diagnosis not present

## 2015-05-10 DIAGNOSIS — Z87891 Personal history of nicotine dependence: Secondary | ICD-10-CM | POA: Insufficient documentation

## 2015-05-10 DIAGNOSIS — O26892 Other specified pregnancy related conditions, second trimester: Secondary | ICD-10-CM | POA: Diagnosis not present

## 2015-05-10 DIAGNOSIS — O26899 Other specified pregnancy related conditions, unspecified trimester: Secondary | ICD-10-CM

## 2015-05-10 LAB — URINALYSIS, ROUTINE W REFLEX MICROSCOPIC
Bilirubin Urine: NEGATIVE
GLUCOSE, UA: NEGATIVE mg/dL
KETONES UR: NEGATIVE mg/dL
LEUKOCYTES UA: NEGATIVE
Nitrite: NEGATIVE
PROTEIN: NEGATIVE mg/dL
Specific Gravity, Urine: >1.03 — ABNORMAL HIGH (ref 1.005–1.030)
UROBILINOGEN UA: 0.2 mg/dL (ref 0.0–1.0)
pH: 5.5 (ref 5.0–8.0)

## 2015-05-10 LAB — URINE MICROSCOPIC-ADD ON

## 2015-05-10 NOTE — MAU Provider Note (Signed)
History     CSN: [id number]  Arrival date and time: 05/10/15 2234   First Provider Initiated Contact with Patient 05/10/15 2317      No chief complaint on file.  HPI  [name] is a 39 y.o. G2P1000 at [redacted]w[redacted]d who presents to MAU today with complaint of vaginal discharge and abdominal cramping. She states that she has had the same discharge since she found out she was pregnant. She states that it is thin and clear. She denies vaginal bleeding. She states that she has had intermittent abdominal cramping for weeks, but worse since Thursday. She has IBS and feels that this is similar to previous issues with IBS. She rates pain at 5/10 now. She hasn't taken anything for pain. She does reports an increase in frequency of BMs, but denies diarrhea or constipation.   OB History    Gravida Para Term Preterm AB TAB SAB Ectopic Multiple Living   Past Medical History  Diagnosis Date  . Vaginal discharge     chronic  . Cervical dysplasia     ASCUS, s/p colposcopy 2005, C1N1  . Depression     onset age teenager; menstrually related.  . Eczema     Lupton/dermatology.  Marland Kitchen UTI (lower urinary tract infection)     Past Surgical History  Procedure Laterality Date  . Wisdom tooth extraction  1999  . Cesarean section  2000    Family History  Problem Relation Age of Onset  . Depression Mother   . Hypertension Mother   . Hyperlipidemia Mother   . Heart disease Neg Hx   . Cancer Father     prostate cancer  . Hyperlipidemia Father   . Thyroid disease Paternal Grandmother     Social History  Substance Use Topics  . Smoking status: Former Smoker -- 3.30 packs/day for 5 years    Types: Cigarettes  . Smokeless tobacco: None  . Alcohol Use: No    Allergies:  Allergies  Allergen Reactions  . Codeine Hives  . Penicillins Hives    Has patient had a PCN reaction causing immediate rash, facial/tongue/throat swelling, SOB or lightheadedness with hypotension:  No Has patient had a PCN reaction causing severe rash involving mucus membranes or skin necrosis: No Has patient had a PCN reaction that required hospitalization No Has patient had a PCN reaction occurring within the last 10 years: No If all of the above answers are "NO", then may proceed with Cephalosporin use.   . Sulfamethoxazole-Trimethoprim Other (See Comments)    REACTION: bumps under skin    No prescriptions prior to admission    Review of Systems  Constitutional: Negative for fever and malaise/fatigue.  Gastrointestinal: Positive for abdominal pain. Negative for nausea, vomiting, diarrhea and constipation.  Genitourinary: Negative for dysuria, urgency and frequency.       Neg - vaginal bleeding + vaginal discharge   Physical Exam   Blood pressure 95/64, pulse 89, temperature 98.1 F (36.7 C), temperature source Oral, resp. rate 16, height  (1.499 m), weight 115 lb (52.164 kg), last menstrual period 02/23/2015, SpO2 97 %.  Physical Exam  Nursing note and vitals reviewed. Constitutional: She is oriented to person, place, and time. She appears well-developed and well-nourished. No distress.  HENT:  Head: Normocephalic and atraumatic.  Cardiovascular: Normal rate.   Respiratory: Effort normal.  GI: Soft. She exhibits no distension and no mass. There is  no tenderness. There is no rebound and no guarding.  Genitourinary: Uterus is enlarged. Uterus is not tender. Cervix exhibits no motion tenderness, no discharge and no friability. No bleeding in the vagina. Vaginal discharge (scant thin, mucus discharge) found.  Neurological: She is alert and oriented to person, place, and time.  Skin: Skin is warm and dry. No erythema.  Psychiatric: She has a normal mood and affect.   Results for orders placed or performed during the hospital encounter of 05/10/15 (from the past 24 hour(s))  Urinalysis, Routine w reflex microscopic (not at [facility])     Status: Abnormal   Collection Time:  05/10/15 10:48 PM  Result Value Ref Range   Color, Urine YELLOW YELLOW   APPearance CLEAR CLEAR   Specific Gravity, Urine >1.030 (H) 1.005 - 1.030   pH 5.5 5.0 - 8.0   Glucose, UA NEGATIVE NEGATIVE mg/dL   Hgb urine dipstick TRACE (A) NEGATIVE   Bilirubin Urine NEGATIVE NEGATIVE   Ketones, ur NEGATIVE NEGATIVE mg/dL   Protein, ur NEGATIVE NEGATIVE mg/dL   Urobilinogen, UA 0.2 0.0 - 1.0 mg/dL   Nitrite NEGATIVE NEGATIVE   Leukocytes, UA NEGATIVE NEGATIVE  Urine microscopic-add on     Status: None   Collection Time: 05/10/15 10:48 PM  Result Value Ref Range   Squamous Epithelial / LPF RARE RARE   WBC, UA 0-2 <3 WBC/hpf   Bacteria, UA RARE RARE   Urine-Other MUCOUS PRESENT   Wet prep, genital     Status: Abnormal   Collection Time: 05/10/15 11:21 PM  Result Value Ref Range   Yeast Wet Prep HPF POC NONE SEEN NONE SEEN   Trich, Wet Prep NONE SEEN NONE SEEN   Clue Cells Wet Prep HPF POC NONE SEEN NONE SEEN   WBC, Wet Prep HPF POC FEW (A) NONE SEEN     MAU Course  Procedures None  MDM FHR - 149 bpm with doppler UA and Wet prep today Discussed patient and lab results with Dr. Sallye Ober. Agrees with plan for discharge at this time. Patient may take Tylenol PRN for pain. Follow-up in the office as scheduled.   Assessment and Plan  A: SIUP at [redacted]w[redacted]d Vaginal discharge in pregnancy Abdominal pain in pregnancy  P: Discharge home Tylenol PRN for pain advised Encouraged increased PO hydration as tolerated Second trimester precautions discussed Patient advised to follow-up with [facility] as scheduled for routine prenatal care or sooner PRN Patient may return to MAU as needed or if her condition were to change or worsen   Marny Lowenstein, PA-C  05/11/2015, 12:50 AM

## 2015-05-10 NOTE — MAU Note (Signed)
Pt c/o frequent stool since Thursday. Also having some clear discharge, wearing a panty liner. Having some lower abdominal cramping but not bleeding.

## 2015-05-11 DIAGNOSIS — R109 Unspecified abdominal pain: Secondary | ICD-10-CM

## 2015-05-11 DIAGNOSIS — O9989 Other specified diseases and conditions complicating pregnancy, childbirth and the puerperium: Secondary | ICD-10-CM | POA: Diagnosis not present

## 2015-05-11 LAB — WET PREP, GENITAL
CLUE CELLS WET PREP: NONE SEEN
Trich, Wet Prep: NONE SEEN
YEAST WET PREP: NONE SEEN

## 2015-05-11 NOTE — Discharge Instructions (Signed)
Second Trimester of Pregnancy  The second trimester is from week 13 through week 28, month 4 through 6. This is often the time in pregnancy that you feel your best. Often times, morning sickness has lessened or quit. You may have more energy, and you may get hungry more often. Your unborn baby (fetus) is growing rapidly. At the end of the sixth month, he or she is about 9 inches long and weighs about 1½ pounds. You will likely feel the baby move (quickening) between 18 and 20 weeks of pregnancy.  HOME CARE   · Avoid all smoking, herbs, and alcohol. Avoid drugs not approved by your doctor.  · Do not use any tobacco products, including cigarettes, chewing tobacco, and electronic cigarettes. If you need help quitting, ask your doctor. You may get counseling or other support to help you quit.  · Only take medicine as told by your doctor. Some medicines are safe and some are not during pregnancy.  · Exercise only as told by your doctor. Stop exercising if you start having cramps.  · Eat regular, healthy meals.  · Wear a good support bra if your breasts are tender.  · Do not use hot tubs, steam rooms, or saunas.  · Wear your seat belt when driving.  · Avoid raw meat, uncooked cheese, and liter boxes and soil used by cats.  · Take your prenatal vitamins.  · Take 1500-2000 milligrams of calcium daily starting at the 20th week of pregnancy until you deliver your baby.  · Try taking medicine that helps you poop (stool softener) as needed, and if your doctor approves. Eat more fiber by eating fresh fruit, vegetables, and whole grains. Drink enough fluids to keep your pee (urine) clear or pale yellow.  · Take warm water baths (sitz baths) to soothe pain or discomfort caused by hemorrhoids. Use hemorrhoid cream if your doctor approves.  · If you have puffy, bulging veins (varicose veins), wear support hose. Raise (elevate) your feet for 15 minutes, 3-4 times a day. Limit salt in your diet.  · Avoid heavy lifting, wear low heals,  and sit up straight.  · Rest with your legs raised if you have leg cramps or low back pain.  · Visit your dentist if you have not gone during your pregnancy. Use a soft toothbrush to brush your teeth. Be gentle when you floss.  · You can have sex (intercourse) unless your doctor tells you not to.  · Go to your doctor visits.  GET HELP IF:   · You feel dizzy.  · You have mild cramps or pressure in your lower belly (abdomen).  · You have a nagging pain in your belly area.  · You continue to feel sick to your stomach (nauseous), throw up (vomit), or have watery poop (diarrhea).  · You have bad smelling fluid coming from your vagina.  · You have pain with peeing (urination).  GET HELP RIGHT AWAY IF:   · You have a fever.  · You are leaking fluid from your vagina.  · You have spotting or bleeding from your vagina.  · You have severe belly cramping or pain.  · You lose or gain weight rapidly.  · You have trouble catching your breath and have chest pain.  · You notice sudden or extreme puffiness (swelling) of your face, hands, ankles, feet, or legs.  · You have not felt the baby move in over an hour.  · You have severe headaches that do   not go away with medicine.  · You have vision changes.     This information is not intended to replace advice given to you by your health care provider. Make sure you discuss any questions you have with your health care provider.     Document Released: 10/12/2009 Document Revised: 08/08/2014 Document Reviewed: 09/18/2012  Elsevier Interactive Patient Education ©2016 [employer]

## 2015-05-20 ENCOUNTER — Encounter (HOSPITAL_COMMUNITY): Payer: Self-pay | Admitting: *Deleted

## 2015-05-20 ENCOUNTER — Inpatient Hospital Stay (HOSPITAL_COMMUNITY)
Admission: AD | Admit: 2015-05-20 | Discharge: 2015-05-21 | Disposition: A | Discharge status: Home / Self Care | Payer: Medicaid Other | Source: Ambulatory Visit | Attending: Obstetrics and Gynecology | Admitting: Obstetrics and Gynecology

## 2015-05-20 DIAGNOSIS — R102 Pelvic and perineal pain: Secondary | ICD-10-CM | POA: Insufficient documentation

## 2015-05-20 DIAGNOSIS — Z87891 Personal history of nicotine dependence: Secondary | ICD-10-CM | POA: Diagnosis not present

## 2015-05-20 DIAGNOSIS — N949 Unspecified condition associated with female genital organs and menstrual cycle: Secondary | ICD-10-CM

## 2015-05-20 DIAGNOSIS — Z3A16 16 weeks gestation of pregnancy: Secondary | ICD-10-CM | POA: Insufficient documentation

## 2015-05-20 DIAGNOSIS — Z880 Allergy status to penicillin: Secondary | ICD-10-CM | POA: Diagnosis not present

## 2015-05-20 DIAGNOSIS — O26892 Other specified pregnancy related conditions, second trimester: Secondary | ICD-10-CM | POA: Insufficient documentation

## 2015-05-20 DIAGNOSIS — N898 Other specified noninflammatory disorders of vagina: Secondary | ICD-10-CM

## 2015-05-20 LAB — URINALYSIS, ROUTINE W REFLEX MICROSCOPIC
BILIRUBIN URINE: NEGATIVE
GLUCOSE, UA: NEGATIVE mg/dL
Hgb urine dipstick: NEGATIVE
KETONES UR: 15 mg/dL — AB
LEUKOCYTES UA: NEGATIVE
NITRITE: NEGATIVE
PH: 5.5 (ref 5.0–8.0)
Protein, ur: NEGATIVE mg/dL
Urobilinogen, UA: 0.2 mg/dL (ref 0.0–1.0)

## 2015-05-20 NOTE — MAU Provider Note (Signed)
History     CSN: [id number]  Arrival date and time: 05/20/15 2251   First Provider Initiated Contact with Patient 05/20/15 2329      Chief Complaint  Patient presents with  . Vaginal Discharge   HPI Comments: Sara Montoya is 39 y.o. G2P1001 at [redacted]w[redacted]d who presents today with vaginal discahgre. She states that it is yellow and itchy. She states that she called the office and could not get an appointment until tomorrow. She states that it became worse this evening, and she felt like she should have it checked tonight. She denies any VB or LOF. She has not started to feel fetal movement.   Vaginal Discharge The patient's primary symptoms include vaginal discharge. This is a new problem. The current episode started yesterday. The problem occurs constantly. The problem has been gradually worsening. The pain is mild. The problem affects both sides. She is pregnant. The vaginal discharge was copious, yellow and thick. There has been no bleeding. Nothing aggravates the symptoms. She has tried nothing for the symptoms.    Past Medical History  Diagnosis Date  . Vaginal discharge     chronic  . Cervical dysplasia     ASCUS, s/p colposcopy 2005, C1N1  . Depression     onset age teenager; menstrually related.  . Eczema     Lupton/dermatology.  Marland Kitchen UTI (lower urinary tract infection)     Past Surgical History  Procedure Laterality Date  . Wisdom tooth extraction  1999  . Cesarean section  2000    Family History  Problem Relation Age of Onset  . Depression Mother   . Hypertension Mother   . Hyperlipidemia Mother   . Heart disease Neg Hx   . Cancer Father     prostate cancer  . Hyperlipidemia Father   . Thyroid disease Paternal Grandmother     Social History  Substance Use Topics  . Smoking status: Former Smoker -- 3.30 packs/day for 5 years    Types: Cigarettes  . Smokeless tobacco: None  . Alcohol Use: No    Allergies:  Allergies  Allergen Reactions  . Codeine Hives   . Penicillins Hives    Has patient had a PCN reaction causing immediate rash, facial/tongue/throat swelling, SOB or lightheadedness with hypotension: No Has patient had a PCN reaction causing severe rash involving mucus membranes or skin necrosis: No Has patient had a PCN reaction that required hospitalization No Has patient had a PCN reaction occurring within the last 10 years: No If all of the above answers are "NO", then may proceed with Cephalosporin use.   . Sulfamethoxazole-Trimethoprim Other (See Comments)    REACTION: bumps under skin    Prescriptions prior to admission  Medication Sig Dispense Refill Last Dose  . Prenatal Vit-Fe Fumarate-FA (PRENATAL MULTIVITAMIN) TABS tablet Take 1 tablet by mouth daily at 12 noon.   04/25/2015 at Unknown time    Review of Systems  Genitourinary: Positive for vaginal discharge.   Physical Exam   Blood pressure 111/59, pulse 92, temperature 98.6 F (37 C), temperature source Oral, resp. rate 16, last menstrual period 02/23/2015.  Physical Exam  Nursing note and vitals reviewed. Constitutional: She is oriented to person, place, and time. She appears well-developed and well-nourished. No distress.  HENT:  Head: Normocephalic.  Cardiovascular: Normal rate.   Respiratory: Effort normal.  GI: Soft. There is no tenderness.  Genitourinary:  External: no lesion Vagina: small amount of white discharge Cervix: pink, smooth, closed/thick Uterus: AGA, FHT 149  with doppler   Neurological: She is alert and oriented to person, place, and time.  Skin: Skin is warm and dry.  Psychiatric: She has a normal mood and affect.    Results for orders placed or performed during the hospital encounter of 05/20/15 (from the past 24 hour(s))  Urinalysis, Routine w reflex microscopic (not [facility])     Status: Abnormal   Collection Time: 05/20/15 11:05 PM  Result Value Ref Range   Color, Urine YELLOW YELLOW   APPearance CLEAR CLEAR   Specific Gravity,  Urine >1.030 (H) 1.005 - 1.030   pH 5.5 5.0 - 8.0   Glucose, UA NEGATIVE NEGATIVE mg/dL   Hgb urine dipstick NEGATIVE NEGATIVE   Bilirubin Urine NEGATIVE NEGATIVE   Ketones, ur 15 (A) NEGATIVE mg/dL   Protein, ur NEGATIVE NEGATIVE mg/dL   Urobilinogen, UA 0.2 0.0 - 1.0 mg/dL   Nitrite NEGATIVE NEGATIVE   Leukocytes, UA NEGATIVE NEGATIVE  Wet prep, genital     Status: Abnormal   Collection Time: 05/20/15 11:30 PM  Result Value Ref Range   Yeast Wet Prep HPF POC NONE SEEN NONE SEEN   Trich, Wet Prep NONE SEEN NONE SEEN   Clue Cells Wet Prep HPF POC NONE SEEN NONE SEEN   WBC, Wet Prep HPF POC FEW (A) NONE SEEN     MAU Course  Procedures  MDM   Assessment and Plan   1. Vaginal discharge during pregnancy in second trimester   2. Round ligament pain    DC home Comfort measures reviewed  2nd Trimester precautions  Bleeding precautions RX: none  Return to MAU as needed FU with OB as planned  Follow-up Information    Follow up with Geryl RankinsVARNADO, EVELYN, MD.   Specialty:  Obstetrics and Gynecology   Why:  As scheduled   Contact information:   [address] [place] [address] [phone]         Tawnya CrookHogan, Apollos Tenbrink Donovan 05/20/2015, 11:31 PM

## 2015-05-20 NOTE — MAU Note (Signed)
Pt states she was having a lot of yellowish and itching discharge with some cramping.  She states the discharge started yesterday.

## 2015-05-21 DIAGNOSIS — N898 Other specified noninflammatory disorders of vagina: Secondary | ICD-10-CM | POA: Diagnosis not present

## 2015-05-21 DIAGNOSIS — O26892 Other specified pregnancy related conditions, second trimester: Secondary | ICD-10-CM | POA: Diagnosis not present

## 2015-05-21 LAB — GC/CHLAMYDIA PROBE AMP (~~LOC~~) NOT AT ARMC
Chlamydia: NEGATIVE
Neisseria Gonorrhea: NEGATIVE

## 2015-05-21 LAB — WET PREP, GENITAL
Clue Cells Wet Prep HPF POC: NONE SEEN
TRICH WET PREP: NONE SEEN
YEAST WET PREP: NONE SEEN

## 2015-05-21 NOTE — Discharge Instructions (Signed)

## 2015-07-01 ENCOUNTER — Telehealth: Payer: Self-pay | Admitting: Obstetrics and Gynecology

## 2015-07-01 ENCOUNTER — Encounter: Payer: Medicaid Other | Attending: Obstetrics & Gynecology

## 2015-07-01 VITALS — Ht 59.0 in | Wt 119.8 lb

## 2015-07-01 DIAGNOSIS — Z713 Dietary counseling and surveillance: Secondary | ICD-10-CM | POA: Insufficient documentation

## 2015-07-01 DIAGNOSIS — O24419 Gestational diabetes mellitus in pregnancy, unspecified control: Secondary | ICD-10-CM | POA: Diagnosis present

## 2015-07-01 NOTE — Telephone Encounter (Signed)
TC from patient--22 weeks, G2P1, GDM, last baby 16 yrs ago.  Called with likely round ligament pain--"had to plunge two drains", then had sharp cramp in lower abdomen, now resolved.  No bleeding or leaking, +FM.  RLP discussed, with comfort measures reviewed.  May take Tylenol 2 ES q 6 hours for 24 hours or prn, tub bath, shower, etc.  To call with any issues or concerns, f/u with [facility] as planned or prn.  Nigel BridgemanVicki Markell Sciascia, CNM 07/01/15 11:20p

## 2015-07-02 NOTE — Progress Notes (Signed)
  Patient was seen on 07/01/15 for Gestational Diabetes self-management . The following learning objectives were met by the patient :   States the definition of Gestational Diabetes  States why dietary management is important in controlling blood glucose  Describes the effects of carbohydrates on blood glucose levels  Demonstrates ability to create a balanced meal plan  Demonstrates carbohydrate counting   States when to check blood glucose levels  Demonstrates proper blood glucose monitoring techniques  States the effect of stress and exercise on blood glucose levels  States the importance of limiting caffeine and abstaining from alcohol and smoking  Plan:  Aim for 2 Carb Choices per meal (30 grams) +/- 1 either way for breakfast Aim for 3 Carb Choices per meal (45 grams) +/- 1 either way from lunch and dinner Aim for 1-2 Carbs per snack Begin reading food labels for Total Carbohydrate and sugar grams of foods Consider  increasing your activity level by walking daily as tolerated Begin checking BG before breakfast and 1-2 hours after first bit of breakfast, lunch and dinner after  as directed by MD  Take medication  as directed by MD  Blood glucose monitor given: Accu-chek [employer] connect Lot # [id number] Exp: 12/30/15  Patient instructed to monitor glucose levels: FBS: 60 - <90 1 hour: <140 2 hour: <120  Patient received the following handouts:  Nutrition Diabetes and Pregnancy  Carbohydrate Counting List  Meal Planning worksheet  Patient will be seen for follow-up as needed.

## 2015-07-14 ENCOUNTER — Inpatient Hospital Stay (HOSPITAL_COMMUNITY)
Admission: AD | Admit: 2015-07-14 | Discharge: 2015-07-15 | Disposition: A | Discharge status: Home / Self Care | Payer: Medicaid Other | Source: Ambulatory Visit | Attending: Obstetrics and Gynecology | Admitting: Obstetrics and Gynecology

## 2015-07-14 ENCOUNTER — Encounter (HOSPITAL_COMMUNITY): Payer: Self-pay | Admitting: *Deleted

## 2015-07-14 DIAGNOSIS — Z87891 Personal history of nicotine dependence: Secondary | ICD-10-CM | POA: Insufficient documentation

## 2015-07-14 DIAGNOSIS — O26892 Other specified pregnancy related conditions, second trimester: Secondary | ICD-10-CM

## 2015-07-14 DIAGNOSIS — O9989 Other specified diseases and conditions complicating pregnancy, childbirth and the puerperium: Secondary | ICD-10-CM | POA: Diagnosis not present

## 2015-07-14 DIAGNOSIS — O24410 Gestational diabetes mellitus in pregnancy, diet controlled: Secondary | ICD-10-CM | POA: Diagnosis not present

## 2015-07-14 DIAGNOSIS — O23592 Infection of other part of genital tract in pregnancy, second trimester: Secondary | ICD-10-CM | POA: Insufficient documentation

## 2015-07-14 DIAGNOSIS — B9689 Other specified bacterial agents as the cause of diseases classified elsewhere: Secondary | ICD-10-CM

## 2015-07-14 DIAGNOSIS — Z880 Allergy status to penicillin: Secondary | ICD-10-CM | POA: Insufficient documentation

## 2015-07-14 DIAGNOSIS — R109 Unspecified abdominal pain: Secondary | ICD-10-CM

## 2015-07-14 DIAGNOSIS — N898 Other specified noninflammatory disorders of vagina: Secondary | ICD-10-CM

## 2015-07-14 DIAGNOSIS — Z3A23 23 weeks gestation of pregnancy: Secondary | ICD-10-CM | POA: Diagnosis not present

## 2015-07-14 DIAGNOSIS — N760 Acute vaginitis: Secondary | ICD-10-CM | POA: Insufficient documentation

## 2015-07-14 DIAGNOSIS — O26899 Other specified pregnancy related conditions, unspecified trimester: Secondary | ICD-10-CM

## 2015-07-14 LAB — URINALYSIS, ROUTINE W REFLEX MICROSCOPIC
Bilirubin Urine: NEGATIVE
Glucose, UA: 250 mg/dL — AB
Hgb urine dipstick: NEGATIVE
KETONES UR: NEGATIVE mg/dL
LEUKOCYTES UA: NEGATIVE
NITRITE: NEGATIVE
PH: 6 (ref 5.0–8.0)
Protein, ur: NEGATIVE mg/dL
Specific Gravity, Urine: 1.02 (ref 1.005–1.030)

## 2015-07-14 NOTE — MAU Note (Signed)
PT SAYS SHE HAS ABD  CRAMPING - STARTED   ON Friday.  CALLED  DR  TODAY  AT 5 PM    ABOUT  LESS  FETAL  MOVEMENT -   CHECK FOR  1  HR-   FELT SOME  MOVEMENT.      STILL FEELS   CRAMPING  LIKE  ON Friday.    NO VE IN OFFICE    LAST  SEX--  SUNDAY

## 2015-07-14 NOTE — MAU Provider Note (Signed)
History     CSN: [id number]  Arrival date and time: 07/14/15 2242   First Provider Initiated Contact with Patient 07/14/15 2332      Chief Complaint  Patient presents with  . Abdominal Cramping   HPI [name] is a 39 y.o. G2P1001 at [redacted]w[redacted]d who presents to MAU today with complaint of abdominal cramping and decreased fetal movement. The patient states a history of PTL at 34 weeks with previous pregnancy requiring medication to stop contractions. She did deliver post dates. She states GDM with this pregnancy, diet controlled. She states lower abdominal cramping since Friday night, intermittent. She denies pain now. She states very sporadic contractions, mild. She called the office at 5 pm today with complaint of decreased fetal movement. She was told to monitor movement x 1 hour and was not reassured so she came to MAU tonight. She has felt normal FM since arrival. She also endorses a yellow discharge with itching. She has seen intermittent clear fluid as well on occasion. She denies vaginal bleeding, N/V/D or constipation or UTI symptoms. She had 1 loose stool yesterday.    OB History    Gravida Para Term Preterm AB TAB SAB Ectopic Multiple Living   Past Medical History  Diagnosis Date  . Vaginal discharge     chronic  . Cervical dysplasia     ASCUS, s/p colposcopy 2005, C1N1  . Depression     onset age teenager; menstrually related.  . Eczema     Lupton/dermatology.  Marland Kitchen UTI (lower urinary tract infection)   . Gestational diabetes mellitus (GDM), antepartum     Past Surgical History  Procedure Laterality Date  . Wisdom tooth extraction  1999  . Cesarean section  2000    Family History  Problem Relation Age of Onset  . Depression Mother   . Hypertension Mother   . Hyperlipidemia Mother   . Heart disease Neg Hx   . Cancer Father     prostate cancer  . Hyperlipidemia Father   . Thyroid disease Paternal Grandmother     Social History   Substance Use Topics  . Smoking status: Former Smoker -- 3.30 packs/day for 5 years    Types: Cigarettes  . Smokeless tobacco: None  . Alcohol Use: No    Allergies:  Allergies  Allergen Reactions  . Codeine Hives  . Penicillins Hives    Has patient had a PCN reaction causing immediate rash, facial/tongue/throat swelling, SOB or lightheadedness with hypotension: No Has patient had a PCN reaction causing severe rash involving mucus membranes or skin necrosis: No Has patient had a PCN reaction that required hospitalization No Has patient had a PCN reaction occurring within the last 10 years: No If all of the above answers are "NO", then may proceed with Cephalosporin use.   . Sulfamethoxazole-Trimethoprim Other (See Comments)    REACTION: bumps under skin    No prescriptions prior to admission    Review of Systems  Constitutional: Negative for fever and malaise/fatigue.  Gastrointestinal: Positive for abdominal pain. Negative for nausea, vomiting, diarrhea and constipation.  Genitourinary: Negative for dysuria, urgency and frequency.       Neg - vaginal bleeding + vaginal discharge, ? LOF   Physical Exam   Blood pressure 106/54, pulse 88, temperature 98.4 F (36.9 C), temperature source Oral, resp. rate 18, height  (1.473 m), weight 127 lb 8 oz (57.834 kg),  last menstrual period 02/23/2015.  Physical Exam  Nursing note and vitals reviewed. Constitutional: She is oriented to person, place, and time. She appears well-developed and well-nourished. No distress.  HENT:  Head: Normocephalic and atraumatic.  Cardiovascular: Normal rate.   Respiratory: Effort normal.  GI: Soft. She exhibits no distension and no mass. There is no tenderness. There is no rebound and no guarding.  Neurological: She is alert and oriented to person, place, and time.  Skin: Skin is warm and dry. No erythema.  Psychiatric: She has a normal mood and affect.  Dilation: Closed Effacement (%):  Thick Cervical Position: Posterior Station: Ballotable Exam by:: Harlon FlorJ. Leanah Kolander, PA-C   Results for orders placed or performed during the hospital encounter of 07/14/15 (from the past 24 hour(s))  Urinalysis, Routine w reflex microscopic (not at [facility])     Status: Abnormal   Collection Time: 07/14/15 10:57 PM  Result Value Ref Range   Color, Urine YELLOW YELLOW   APPearance CLEAR CLEAR   Specific Gravity, Urine 1.020 1.005 - 1.030   pH 6.0 5.0 - 8.0   Glucose, UA 250 (A) NEGATIVE mg/dL   Hgb urine dipstick NEGATIVE NEGATIVE   Bilirubin Urine NEGATIVE NEGATIVE   Ketones, ur NEGATIVE NEGATIVE mg/dL   Protein, ur NEGATIVE NEGATIVE mg/dL   Nitrite NEGATIVE NEGATIVE   Leukocytes, UA NEGATIVE NEGATIVE  Wet prep, genital     Status: Abnormal   Collection Time: 07/14/15 11:45 PM  Result Value Ref Range   Yeast Wet Prep HPF POC NONE SEEN NONE SEEN   Trich, Wet Prep NONE SEEN NONE SEEN   Clue Cells Wet Prep HPF POC PRESENT (A) NONE SEEN   WBC, Wet Prep HPF POC MODERATE (A) NONE SEEN   Sperm NONE SEEN     Fetal Monitoring: Baseline: 135 bpm, moderate variability, + accelerations, no decelerations Contractions: once contraction noted, mild at 2335, none since then  MAU Course  Procedures None  MDM UA today without evidence of dehydration or infection Fern - negative Wet prep obtained FFN obtained prior to SVE. Not sent as cervix is closed and 1 contraction in ~ 1 hours Pre-term labor has been ruled out at this point, as well as UTI Discussed patient with Dr. Estanislado Pandyivard. She agrees with plan to discharge patient today with reassurance and follow-up in the office as scheduled. FFN is not needed given cervical exam and lack of consistent contractions.   Wet prep + clue cells.  Assessment and Plan  A: SIUP at 3724w6d Abdominal pain in pregnancy Bacterial vaginosis  P: Discharge home Rx for Flagyl given to patient. Patient unsure if she wants to take this medication while pregnant. Advised  to call the office to discuss.  Tylenol PRN for pain advised Discussed use of abdominal binder for pain Preterm labor precautions discussed Patient [facility] for routine prenatal care or sooner PRN Patient may return to MAU as needed or if her condition were to change or worsen  Marny LowensteinJulie N Lowen [place], PA-C  07/15/2015, 2:18 AM

## 2015-07-15 LAB — WET PREP, GENITAL
Sperm: NONE SEEN
TRICH WET PREP: NONE SEEN
Yeast Wet Prep HPF POC: NONE SEEN

## 2015-07-15 MED ORDER — METRONIDAZOLE 500 MG PO TABS: 500.0000 mg | ORAL_TABLET | Freq: Two times a day (BID) | ORAL | Status: DC

## 2015-07-15 NOTE — Discharge Instructions (Signed)
Fetal Movement Counts  Patient Name: __________________________________________________ Patient Due Date: ____________________  Performing a fetal movement count is highly recommended in high-risk pregnancies, but it is good for every pregnant woman to do. Your health care provider may ask you to start counting fetal movements at 28 weeks of the pregnancy. Fetal movements often increase:  · After eating a full meal.  · After physical activity.  · After eating or drinking something sweet or cold.  · At rest.  Pay attention to when you feel the baby is most active. This will help you notice a pattern of your baby's sleep and wake cycles and what factors contribute to an increase in fetal movement. It is important to perform a fetal movement count at the same time each day when your baby is normally most active.   HOW TO COUNT FETAL MOVEMENTS  1. Find a quiet and comfortable area to sit or lie down on your left side. Lying on your left side provides the best blood and oxygen circulation to your baby.  2. Write down the day and time on a sheet of paper or in a journal.  3. Start counting kicks, flutters, swishes, rolls, or jabs in a 2-hour period. You should feel at least 10 movements within 2 hours.  4. If you do not feel 10 movements in 2 hours, wait 2-3 hours and count again. Look for a change in the pattern or not enough counts in 2 hours.  SEEK MEDICAL CARE IF:  · You feel less than 10 counts in 2 hours, tried twice.  · There is no movement in over an hour.  · The pattern is changing or taking longer each day to reach 10 counts in 2 hours.  · You feel the baby is not moving as he or she usually does.  Date: ____________ Movements: ____________ Start time: ____________ Finish time: ____________   Date: ____________ Movements: ____________ Start time: ____________ Finish time: ____________  Date: ____________ Movements: ____________ Start time: ____________ Finish time: ____________  Date: ____________ Movements:  ____________ Start time: ____________ Finish time: ____________  Date: ____________ Movements: ____________ Start time: ____________ Finish time: ____________  Date: ____________ Movements: ____________ Start time: ____________ Finish time: ____________  Date: ____________ Movements: ____________ Start time: ____________ Finish time: ____________  Date: ____________ Movements: ____________ Start time: ____________ Finish time: ____________   Date: ____________ Movements: ____________ Start time: ____________ Finish time: ____________  Date: ____________ Movements: ____________ Start time: ____________ Finish time: ____________  Date: ____________ Movements: ____________ Start time: ____________ Finish time: ____________  Date: ____________ Movements: ____________ Start time: ____________ Finish time: ____________  Date: ____________ Movements: ____________ Start time: ____________ Finish time: ____________  Date: ____________ Movements: ____________ Start time: ____________ Finish time: ____________  Date: ____________ Movements: ____________ Start time: ____________ Finish time: ____________   Date: ____________ Movements: ____________ Start time: ____________ Finish time: ____________  Date: ____________ Movements: ____________ Start time: ____________ Finish time: ____________  Date: ____________ Movements: ____________ Start time: ____________ Finish time: ____________  Date: ____________ Movements: ____________ Start time: ____________ Finish time: ____________  Date: ____________ Movements: ____________ Start time: ____________ Finish time: ____________  Date: ____________ Movements: ____________ Start time: ____________ Finish time: ____________  Date: ____________ Movements: ____________ Start time: ____________ Finish time: ____________   Date: ____________ Movements: ____________ Start time: ____________ Finish time: ____________  Date: ____________ Movements: ____________ Start time: ____________ Finish  time: ____________  Date: ____________ Movements: ____________ Start time: ____________ Finish time: ____________  Date: ____________ Movements: ____________ Start time:   ____________ Finish time: ____________  Date: ____________ Movements: ____________ Start time: ____________ Finish time: ____________  Date: ____________ Movements: ____________ Start time: ____________ Finish time: ____________  Date: ____________ Movements: ____________ Start time: ____________ Finish time: ____________   Date: ____________ Movements: ____________ Start time: ____________ Finish time: ____________  Date: ____________ Movements: ____________ Start time: ____________ Finish time: ____________  Date: ____________ Movements: ____________ Start time: ____________ Finish time: ____________  Date: ____________ Movements: ____________ Start time: ____________ Finish time: ____________  Date: ____________ Movements: ____________ Start time: ____________ Finish time: ____________  Date: ____________ Movements: ____________ Start time: ____________ Finish time: ____________  Date: ____________ Movements: ____________ Start time: ____________ Finish time: ____________   Date: ____________ Movements: ____________ Start time: ____________ Finish time: ____________  Date: ____________ Movements: ____________ Start time: ____________ Finish time: ____________  Date: ____________ Movements: ____________ Start time: ____________ Finish time: ____________  Date: ____________ Movements: ____________ Start time: ____________ Finish time: ____________  Date: ____________ Movements: ____________ Start time: ____________ Finish time: ____________  Date: ____________ Movements: ____________ Start time: ____________ Finish time: ____________  Date: ____________ Movements: ____________ Start time: ____________ Finish time: ____________   Date: ____________ Movements: ____________ Start time: ____________ Finish time: ____________  Date: ____________  Movements: ____________ Start time: ____________ Finish time: ____________  Date: ____________ Movements: ____________ Start time: ____________ Finish time: ____________  Date: ____________ Movements: ____________ Start time: ____________ Finish time: ____________  Date: ____________ Movements: ____________ Start time: ____________ Finish time: ____________  Date: ____________ Movements: ____________ Start time: ____________ Finish time: ____________  Date: ____________ Movements: ____________ Start time: ____________ Finish time: ____________   Date: ____________ Movements: ____________ Start time: ____________ Finish time: ____________  Date: ____________ Movements: ____________ Start time: ____________ Finish time: ____________  Date: ____________ Movements: ____________ Start time: ____________ Finish time: ____________  Date: ____________ Movements: ____________ Start time: ____________ Finish time: ____________  Date: ____________ Movements: ____________ Start time: ____________ Finish time: ____________  Date: ____________ Movements: ____________ Start time: ____________ Finish time: ____________     This information is not intended to replace advice given to you by your health care provider. Make sure you discuss any questions you have with your health care provider.     Document Released: 08/17/2006 Document Revised: 08/08/2014 Document Reviewed: 05/14/2012  Elsevier Interactive Patient Education ©2016 [employer] Inc[employer]

## 2015-08-04 ENCOUNTER — Inpatient Hospital Stay (HOSPITAL_COMMUNITY)
Admission: AD | Admit: 2015-08-04 | Discharge: 2015-08-05 | Disposition: A | Discharge status: Home / Self Care | Payer: Medicaid Other | Source: Ambulatory Visit | Attending: Obstetrics and Gynecology | Admitting: Obstetrics and Gynecology

## 2015-08-04 ENCOUNTER — Inpatient Hospital Stay (HOSPITAL_COMMUNITY): Payer: Medicaid Other

## 2015-08-04 ENCOUNTER — Encounter (HOSPITAL_COMMUNITY): Payer: Self-pay | Admitting: *Deleted

## 2015-08-04 DIAGNOSIS — Z87891 Personal history of nicotine dependence: Secondary | ICD-10-CM | POA: Diagnosis not present

## 2015-08-04 DIAGNOSIS — O9A212 Injury, poisoning and certain other consequences of external causes complicating pregnancy, second trimester: Secondary | ICD-10-CM | POA: Diagnosis not present

## 2015-08-04 DIAGNOSIS — Z041 Encounter for examination and observation following transport accident: Secondary | ICD-10-CM | POA: Insufficient documentation

## 2015-08-04 DIAGNOSIS — Z3A26 26 weeks gestation of pregnancy: Secondary | ICD-10-CM

## 2015-08-04 DIAGNOSIS — S3991XA Unspecified injury of abdomen, initial encounter: Secondary | ICD-10-CM

## 2015-08-04 DIAGNOSIS — O24410 Gestational diabetes mellitus in pregnancy, diet controlled: Secondary | ICD-10-CM

## 2015-08-04 DIAGNOSIS — O26892 Other specified pregnancy related conditions, second trimester: Secondary | ICD-10-CM | POA: Diagnosis not present

## 2015-08-04 DIAGNOSIS — O26899 Other specified pregnancy related conditions, unspecified trimester: Secondary | ICD-10-CM

## 2015-08-04 DIAGNOSIS — Z98891 History of uterine scar from previous surgery: Secondary | ICD-10-CM

## 2015-08-04 DIAGNOSIS — T1490 Injury, unspecified: Secondary | ICD-10-CM | POA: Diagnosis not present

## 2015-08-04 DIAGNOSIS — O09522 Supervision of elderly multigravida, second trimester: Secondary | ICD-10-CM

## 2015-08-04 DIAGNOSIS — R109 Unspecified abdominal pain: Secondary | ICD-10-CM

## 2015-08-04 LAB — CBC WITH DIFFERENTIAL/PLATELET
BASOS PCT: 0 %
Basophils Absolute: 0 10*3/uL (ref 0.0–0.1)
EOS ABS: 0.1 10*3/uL (ref 0.0–0.7)
Eosinophils Relative: 1 %
HCT: 34 % — ABNORMAL LOW (ref 36.0–46.0)
HEMOGLOBIN: 11.6 g/dL — AB (ref 12.0–15.0)
LYMPHS ABS: 1.5 10*3/uL (ref 0.7–4.0)
Lymphocytes Relative: 16 %
MCH: 31.3 pg (ref 26.0–34.0)
MCHC: 34.1 g/dL (ref 30.0–36.0)
MCV: 91.6 fL (ref 78.0–100.0)
MONO ABS: 0.9 10*3/uL (ref 0.1–1.0)
MONOS PCT: 10 %
NEUTROS PCT: 73 %
Neutro Abs: 6.7 10*3/uL (ref 1.7–7.7)
PLATELETS: 243 10*3/uL (ref 150–400)
RBC: 3.71 MIL/uL — ABNORMAL LOW (ref 3.87–5.11)
RDW: 14.1 % (ref 11.5–15.5)
WBC: 9.3 10*3/uL (ref 4.0–10.5)

## 2015-08-04 LAB — PROTIME-INR
INR: 1 (ref 0.00–1.49)
PROTHROMBIN TIME: 13.4 s (ref 11.6–15.2)

## 2015-08-04 LAB — FIBRINOGEN: FIBRINOGEN: 419 mg/dL (ref 204–475)

## 2015-08-04 LAB — APTT: aPTT: 23 seconds — ABNORMAL LOW (ref 24–37)
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
## 2015-08-05 DIAGNOSIS — T1490 Injury, unspecified: Secondary | ICD-10-CM

## 2015-08-05 DIAGNOSIS — O9A212 Injury, poisoning and certain other consequences of external causes complicating pregnancy, second trimester: Secondary | ICD-10-CM

## 2015-08-05 DIAGNOSIS — Z3A26 26 weeks gestation of pregnancy: Secondary | ICD-10-CM | POA: Diagnosis not present

## 2015-08-05 LAB — KLEIHAUER-BETKE STAIN
# Vials RhIg: 1
Fetal Cells %: 0 %
Quantitation Fetal Hemoglobin: 0 mL

## 2015-08-05 MED ORDER — ACETAMINOPHEN 500 MG PO TABS: 1000.0000 mg | ORAL_TABLET | Freq: Once | ORAL | Status: DC

## 2015-08-05 NOTE — Discharge Instructions (Signed)
What Do I Need to Know About Injuries During Pregnancy? °Injuries can happen during pregnancy. Minor falls and accidents usually do not harm you or your baby. However, any injury should be reported to your doctor. °WHAT CAN I DO TO PROTECT MYSELF FROM INJURIES? °· Remove rugs and loose objects on the floor. °· Wear comfortable shoes that have a good grip. Do not wear high-heeled shoes. °· Always wear your seat belt. The lap belt should be below your belly. Always practice safe driving. °· Do not ride on a motorcycle. °· Do not participate in high-impact activities or sports. °· Avoid: °¨ Walking on wet or slippery floors. °¨ Fires. °¨ Starting fires. °¨ Lifting heavy pots of boiling or hot liquids. °¨ Fixing electrical problems. °· Only take medicine as told by your doctor. °· Know your blood type and the blood type of the baby's father. °· Call your local emergency services (911 in the [place] if you are a victim of domestic violence or assault. For help and support, contact the National Domestic Violence Hotline. °WHEN SHOULD I GET HELP RIGHT AWAY? °· You fall on your belly or have any high-impact accident or injury. °· You have been a victim of domestic violence or any kind of violence. °· You have been in a car accident. °· You have bleeding from your vagina. °· Fluid is leaking from your vagina. °· You start to have belly cramping (contractions) or pain. °· You feel weak or pass out (faint). °· You start to throw up (vomit) after an injury. °· You have been burned. °· You have a stiff neck or neck pain. °· You get a headache or have vision problems after an injury. °· You do not feel the baby move or the baby is not moving as much as normal. °  °This information is not intended to replace advice given to you by your health care provider. Make sure you discuss any questions you have with your health care provider. °  °Document Released: 08/20/2010 Document Revised: 08/08/2014 Document Reviewed:  04/24/2013 °Elsevier Interactive Patient Education ©2016 Elsevier Inc. ° °

## 2015-08-21 ENCOUNTER — Inpatient Hospital Stay (HOSPITAL_COMMUNITY)
Admission: AD | Admit: 2015-08-21 | Discharge: 2015-08-21 | Disposition: A | Discharge status: Home / Self Care | Payer: Medicaid Other | Source: Ambulatory Visit | Attending: Obstetrics and Gynecology | Admitting: Obstetrics and Gynecology

## 2015-08-21 ENCOUNTER — Encounter (HOSPITAL_COMMUNITY): Payer: Self-pay

## 2015-08-21 DIAGNOSIS — R109 Unspecified abdominal pain: Secondary | ICD-10-CM | POA: Diagnosis present

## 2015-08-21 DIAGNOSIS — Z3A29 29 weeks gestation of pregnancy: Secondary | ICD-10-CM | POA: Insufficient documentation

## 2015-08-21 DIAGNOSIS — N858 Other specified noninflammatory disorders of uterus: Secondary | ICD-10-CM

## 2015-08-21 DIAGNOSIS — O368130 Decreased fetal movements, third trimester, not applicable or unspecified: Secondary | ICD-10-CM | POA: Diagnosis not present

## 2015-08-21 DIAGNOSIS — O9989 Other specified diseases and conditions complicating pregnancy, childbirth and the puerperium: Secondary | ICD-10-CM

## 2015-08-21 DIAGNOSIS — N898 Other specified noninflammatory disorders of vagina: Secondary | ICD-10-CM | POA: Diagnosis present

## 2015-08-21 DIAGNOSIS — N859 Noninflammatory disorder of uterus, unspecified: Secondary | ICD-10-CM

## 2015-08-21 LAB — URINALYSIS, ROUTINE W REFLEX MICROSCOPIC
Bilirubin Urine: NEGATIVE
Glucose, UA: 250 mg/dL — AB
Hgb urine dipstick: NEGATIVE
KETONES UR: NEGATIVE mg/dL
LEUKOCYTES UA: NEGATIVE
NITRITE: NEGATIVE
PROTEIN: NEGATIVE mg/dL
Specific Gravity, Urine: 1.015 (ref 1.005–1.030)
pH: 6.5 (ref 5.0–8.0)

## 2015-08-21 LAB — FETAL FIBRONECTIN: FETAL FIBRONECTIN: NEGATIVE

## 2015-08-21 NOTE — MAU Provider Note (Signed)
Chief Complaint:  Vaginal Discharge and Abdominal Pain   First Provider Initiated Contact with Patient 08/21/15 1742     HPI: Sara Montoya is a 40 y.o. G2P1001 at 39w2dwho presents to maternity admissions reporting abdominal cramping and mucous vaginal discharge.  Also states baby has not moved for 3 hours, though she feels it is moving now. . She reports good fetal movement now, denies LOF, vaginal bleeding, vaginal itching/burning, urinary symptoms, h/a, dizziness, n/v, diarrhea, constipation or fever/chills.  She denies headache, visual changes.   Abdominal Cramping This is a new problem. The current episode started today. The onset quality is gradual. The problem occurs intermittently. The problem has been gradually improving. The pain is located in the suprapubic region. The pain is mild. The quality of the pain is cramping. The abdominal pain does not radiate. Pertinent negatives include no constipation, diarrhea, dysuria, fever, frequency, myalgias, nausea or vomiting. Nothing aggravates the pain. The pain is relieved by nothing. She has tried nothing for the symptoms.   RN Note: Abdominal pain started today with vaginal discharge and decreased fetal movement.           Past Medical History: Past Medical History  Diagnosis Date  . Vaginal discharge     chronic  . Cervical dysplasia     ASCUS, s/p colposcopy 2005, C1N1  . Depression     onset age teenager; menstrually related.  . Eczema     Lupton/dermatology.  Marland Kitchen UTI (lower urinary tract infection)   . Gestational diabetes mellitus (GDM), antepartum     Past obstetric history: OB History  Gravida Para Term Preterm AB SAB TAB Ectopic Multiple Living  # Outcome Date GA Lbr Len/2nd Weight Sex Delivery Anes PTL Lv  2 Current           1 Term      CS-[id number]         Past Surgical History: Past Surgical History  Procedure Laterality Date  . Wisdom tooth extraction  1999  . Cesarean section  2000     Family History: Family History  Problem Relation Age of Onset  . Depression Mother   . Hypertension Mother   . Hyperlipidemia Mother   . Heart disease Neg Hx   . Cancer Father     prostate cancer  . Hyperlipidemia Father   . Thyroid disease Paternal Grandmother     Social History: Social History  Substance Use Topics  . Smoking status: Former Smoker -- 3.30 packs/day for 5 years    Types: Cigarettes  . Smokeless tobacco: None  . Alcohol Use: No    Allergies:  Allergies  Allergen Reactions  . Codeine Hives  . Latex Hives  . Penicillins Hives    Has patient had a PCN reaction causing immediate rash, facial/tongue/throat swelling, SOB or lightheadedness with hypotension: No Has patient had a PCN reaction causing severe rash involving mucus membranes or skin necrosis: No Has patient had a PCN reaction that required hospitalization No Has patient had a PCN reaction occurring within the last 10 years: No If all of the above answers are "NO", then may proceed with Cephalosporin use.   . Sulfamethoxazole-Trimethoprim Other (See Comments)    REACTION: bumps under skin    Meds:  Prescriptions prior to admission  Medication Sig Dispense Refill Last Dose  . Prenatal Vit-Fe Fumarate-FA (PRENATAL MULTIVITAMIN) TABS tablet Take 1 tablet by mouth daily at 12 noon.  08/21/2015 at Unknown time  . metroNIDAZOLE (FLAGYL) 500 MG tablet Take 1 tablet (500 mg total) by mouth 2 (two) times daily. (Patient not taking: Reported on 08/21/2015) 14 tablet 0 Not Taking at Unknown time    I have reviewed patient's Past Medical Hx, Surgical Hx, Family Hx, Social Hx, medications and allergies.   ROS:  Review of Systems  Constitutional: Negative for fever, chills, appetite change and fatigue.  Respiratory: Negative for shortness of breath.   Gastrointestinal: Positive for abdominal pain. Negative for nausea, vomiting, diarrhea and constipation.  Genitourinary: Positive for vaginal discharge  (mucous). Negative for dysuria, frequency, flank pain and vaginal bleeding.  Musculoskeletal: Negative for myalgias and back pain.  Neurological: Negative for dizziness.   Physical Exam  Patient Vitals for the past 24 hrs:  BP Temp Temp src Pulse Resp  08/21/15 1746 (!) 106/53 mmHg 98 F (36.7 C) Oral 94 18   Constitutional: Well-developed, well-nourished female in no acute distress.  Cardiovascular: normal rate and rhythm Respiratory: normal effort, clear to auscultate bilaterally GI: Abd soft, non-tender, gravid appropriate for gestational age.   No rebound or guarding. MS: Extremities nontender, no edema, normal ROM Neurologic: Alert and oriented x 4.  GU: Neg CVAT.  PELVIC EXAM: Cervix firm, posterior, neg CMT, uterus nontender, Fundal Height consistent with dates, adnexa without tenderness, enlargement, or mass   Scant clear mucous discharge    FHT:  Baseline 145 , moderate variability, accelerations present, no decelerations Contractions:   Rare only one seen, Possible mild uterine irritability (5 seconds each)   Labs: Results for orders placed or performed during the hospital encounter of 08/21/15 (from the past 24 hour(s))  Urinalysis, Routine w reflex microscopic (not at [facility])     Status: Abnormal   Collection Time: 08/21/15  5:30 PM  Result Value Ref Range   Color, Urine YELLOW YELLOW   APPearance CLEAR CLEAR   Specific Gravity, Urine 1.015 1.005 - 1.030   pH 6.5 5.0 - 8.0   Glucose, UA 250 (A) NEGATIVE mg/dL   Hgb urine dipstick NEGATIVE NEGATIVE   Bilirubin Urine NEGATIVE NEGATIVE   Ketones, ur NEGATIVE NEGATIVE mg/dL   Protein, ur NEGATIVE NEGATIVE mg/dL   Nitrite NEGATIVE NEGATIVE   Leukocytes, UA NEGATIVE NEGATIVE   --/--/A POS (09/19 2034)  Imaging:  [place] Mfm Ob Limited  08/05/2015  OBSTETRICAL ULTRASOUND: This exam was performed within a [place] [facility]. The OB US report was generated in the [facility] system, and faxed to the ordering physician.   This report is available in the [employer]. See the [facility] Obstetric US report via the Image Link.   MAU Course/MDM: I have ordered labs and reviewed results.  NST reviewed Consult Dr Richardson Dopp with presentation, exam findings and test results.  Treatments in MAU included Fetal Fibronectin.  >>    Negative Pt stable at time of discharge.  Assessment: SIUP at [redacted]w[redacted]d  Uterine irritability Decreased fetal movement, now moving a lot Reassuring fetal heart rate tracing  Plan: Discharge home Preterm Labor precautions and fetal kick counts Follow up in Office for prenatal visits and recheck of cervix if needed    Medication List    ASK your doctor about these medications        metroNIDAZOLE 500 MG tablet  Commonly known as:  FLAGYL  Take 1 tablet (500 mg total) by mouth 2 (two) times daily.     prenatal multivitamin Tabs tablet  Take 1 tablet by mouth daily at 12 noon.  Wynelle Bourgeois CNM, MSN Certified Nurse-Midwife 08/21/2015 6:13 PM

## 2015-08-21 NOTE — Discharge Instructions (Signed)
Fetal Movement Counts  Patient Name: __________________________________________________ Patient Due Date: ____________________  Performing a fetal movement count is highly recommended in high-risk pregnancies, but it is good for every pregnant woman to do. Your health care provider may ask you to start counting fetal movements at 28 weeks of the pregnancy. Fetal movements often increase:  · After eating a full meal.  · After physical activity.  · After eating or drinking something sweet or cold.  · At rest.  Pay attention to when you feel the baby is most active. This will help you notice a pattern of your baby's sleep and wake cycles and what factors contribute to an increase in fetal movement. It is important to perform a fetal movement count at the same time each day when your baby is normally most active.   HOW TO COUNT FETAL MOVEMENTS  1. Find a quiet and comfortable area to sit or lie down on your left side. Lying on your left side provides the best blood and oxygen circulation to your baby.  2. Write down the day and time on a sheet of paper or in a journal.  3. Start counting kicks, flutters, swishes, rolls, or jabs in a 2-hour period. You should feel at least 10 movements within 2 hours.  4. If you do not feel 10 movements in 2 hours, wait 2-3 hours and count again. Look for a change in the pattern or not enough counts in 2 hours.  SEEK MEDICAL CARE IF:  · You feel less than 10 counts in 2 hours, tried twice.  · There is no movement in over an hour.  · The pattern is changing or taking longer each day to reach 10 counts in 2 hours.  · You feel the baby is not moving as he or she usually does.  Date: ____________ Movements: ____________ Start time: ____________ Finish time: ____________   Date: ____________ Movements: ____________ Start time: ____________ Finish time: ____________  Date: ____________ Movements: ____________ Start time: ____________ Finish time: ____________  Date: ____________ Movements:  ____________ Start time: ____________ Finish time: ____________  Date: ____________ Movements: ____________ Start time: ____________ Finish time: ____________  Date: ____________ Movements: ____________ Start time: ____________ Finish time: ____________  Date: ____________ Movements: ____________ Start time: ____________ Finish time: ____________  Date: ____________ Movements: ____________ Start time: ____________ Finish time: ____________   Date: ____________ Movements: ____________ Start time: ____________ Finish time: ____________  Date: ____________ Movements: ____________ Start time: ____________ Finish time: ____________  Date: ____________ Movements: ____________ Start time: ____________ Finish time: ____________  Date: ____________ Movements: ____________ Start time: ____________ Finish time: ____________  Date: ____________ Movements: ____________ Start time: ____________ Finish time: ____________  Date: ____________ Movements: ____________ Start time: ____________ Finish time: ____________  Date: ____________ Movements: ____________ Start time: ____________ Finish time: ____________   Date: ____________ Movements: ____________ Start time: ____________ Finish time: ____________  Date: ____________ Movements: ____________ Start time: ____________ Finish time: ____________  Date: ____________ Movements: ____________ Start time: ____________ Finish time: ____________  Date: ____________ Movements: ____________ Start time: ____________ Finish time: ____________  Date: ____________ Movements: ____________ Start time: ____________ Finish time: ____________  Date: ____________ Movements: ____________ Start time: ____________ Finish time: ____________  Date: ____________ Movements: ____________ Start time: ____________ Finish time: ____________   Date: ____________ Movements: ____________ Start time: ____________ Finish time: ____________  Date: ____________ Movements: ____________ Start time: ____________ Finish  time: ____________  Date: ____________ Movements: ____________ Start time: ____________ Finish time: ____________  Date: ____________ Movements: ____________ Start time:   ____________ Finish time: ____________  Date: ____________ Movements: ____________ Start time: ____________ Finish time: ____________  Date: ____________ Movements: ____________ Start time: ____________ Finish time: ____________  Date: ____________ Movements: ____________ Start time: ____________ Finish time: ____________   Date: ____________ Movements: ____________ Start time: ____________ Finish time: ____________  Date: ____________ Movements: ____________ Start time: ____________ Finish time: ____________  Date: ____________ Movements: ____________ Start time: ____________ Finish time: ____________  Date: ____________ Movements: ____________ Start time: ____________ Finish time: ____________  Date: ____________ Movements: ____________ Start time: ____________ Finish time: ____________  Date: ____________ Movements: ____________ Start time: ____________ Finish time: ____________  Date: ____________ Movements: ____________ Start time: ____________ Finish time: ____________   Date: ____________ Movements: ____________ Start time: ____________ Finish time: ____________  Date: ____________ Movements: ____________ Start time: ____________ Finish time: ____________  Date: ____________ Movements: ____________ Start time: ____________ Finish time: ____________  Date: ____________ Movements: ____________ Start time: ____________ Finish time: ____________  Date: ____________ Movements: ____________ Start time: ____________ Finish time: ____________  Date: ____________ Movements: ____________ Start time: ____________ Finish time: ____________  Date: ____________ Movements: ____________ Start time: ____________ Finish time: ____________   Date: ____________ Movements: ____________ Start time: ____________ Finish time: ____________  Date: ____________  Movements: ____________ Start time: ____________ Finish time: ____________  Date: ____________ Movements: ____________ Start time: ____________ Finish time: ____________  Date: ____________ Movements: ____________ Start time: ____________ Finish time: ____________  Date: ____________ Movements: ____________ Start time: ____________ Finish time: ____________  Date: ____________ Movements: ____________ Start time: ____________ Finish time: ____________  Date: ____________ Movements: ____________ Start time: ____________ Finish time: ____________   Date: ____________ Movements: ____________ Start time: ____________ Finish time: ____________  Date: ____________ Movements: ____________ Start time: ____________ Finish time: ____________  Date: ____________ Movements: ____________ Start time: ____________ Finish time: ____________  Date: ____________ Movements: ____________ Start time: ____________ Finish time: ____________  Date: ____________ Movements: ____________ Start time: ____________ Finish time: ____________  Date: ____________ Movements: ____________ Start time: ____________ Finish time: ____________     This information is not intended to replace advice given to you by your health care provider. Make sure you discuss any questions you have with your health care provider.     Document Released: 08/17/2006 Document Revised: 08/08/2014 Document Reviewed: 05/14/2012  Elsevier Interactive Patient Education ©2016 [employer] Inc[employer]

## 2015-08-21 NOTE — MAU Note (Signed)
Urine in lab 

## 2015-08-21 NOTE — MAU Note (Signed)
Abdominal pain started today with vaginal discharge and decreased fetal movement.

## 2015-09-28 ENCOUNTER — Encounter (HOSPITAL_COMMUNITY): Payer: Self-pay | Admitting: *Deleted

## 2015-09-28 ENCOUNTER — Inpatient Hospital Stay (HOSPITAL_COMMUNITY)
Admission: AD | Admit: 2015-09-28 | Discharge: 2015-09-28 | Disposition: A | Discharge status: Home / Self Care | Payer: Medicaid Other | Source: Ambulatory Visit | Attending: Obstetrics and Gynecology | Admitting: Obstetrics and Gynecology

## 2015-09-28 DIAGNOSIS — O1203 Gestational edema, third trimester: Secondary | ICD-10-CM | POA: Diagnosis not present

## 2015-09-28 DIAGNOSIS — Z3A34 34 weeks gestation of pregnancy: Secondary | ICD-10-CM | POA: Diagnosis not present

## 2015-09-28 DIAGNOSIS — Z87891 Personal history of nicotine dependence: Secondary | ICD-10-CM | POA: Diagnosis not present

## 2015-09-28 DIAGNOSIS — O4703 False labor before 37 completed weeks of gestation, third trimester: Secondary | ICD-10-CM | POA: Insufficient documentation

## 2015-09-28 DIAGNOSIS — Z880 Allergy status to penicillin: Secondary | ICD-10-CM | POA: Diagnosis not present

## 2015-09-28 LAB — URINALYSIS, ROUTINE W REFLEX MICROSCOPIC
Bilirubin Urine: NEGATIVE
GLUCOSE, UA: NEGATIVE mg/dL
Hgb urine dipstick: NEGATIVE
KETONES UR: NEGATIVE mg/dL
LEUKOCYTES UA: NEGATIVE
NITRITE: NEGATIVE
PH: 5.5 (ref 5.0–8.0)
PROTEIN: NEGATIVE mg/dL
Specific Gravity, Urine: 1.01 (ref 1.005–1.030)

## 2015-09-28 NOTE — MAU Provider Note (Signed)
History   [id number]   Chief Complaint  Patient presents with  . swelling     HPI Sara Montoya is a 40 y.o. female  G2P1001 at [redacted]w[redacted]d IUP here with report of increased hand and feet swelling over past two days.  Denies  headache, vision changes, or epigastric pain. Hand hurt to open and close, particularly in the morning.  Feet with increased pain at the end of the day.  Pregnancy complicated by diet controlled GDM, otherwise normal.    Patient's last menstrual period was 02/23/2015.  OB History  Gravida Para Term Preterm AB SAB TAB Ectopic Multiple Living  # Outcome Date GA Lbr Len/2nd Weight Sex Delivery Anes PTL Lv  2 Current           1 Term      CS-LTranv         Past Medical History  Diagnosis Date  . Vaginal discharge     chronic  . Cervical dysplasia     ASCUS, s/p colposcopy 2005, C1N1  . Depression     onset age teenager; menstrually related.  . Eczema     Lupton/dermatology.  Marland Kitchen UTI (lower urinary tract infection)   . Gestational diabetes mellitus (GDM), antepartum     Family History  Problem Relation Age of Onset  . Depression Mother   . Hypertension Mother   . Hyperlipidemia Mother   . Heart disease Neg Hx   . Cancer Father     prostate cancer  . Hyperlipidemia Father   . Thyroid disease Paternal Grandmother     Social History   Social History  . Marital Status: Single    Spouse Name: N/A  . Number of Children: N/A  . Years of Education: N/A   Social History Main Topics  . Smoking status: Former Smoker -- 3.30 packs/day for 5 years    Types: Cigarettes  . Smokeless tobacco: None  . Alcohol Use: No  . Drug Use: No  . Sexual Activity: Yes   Other Topics Concern  . None   Social History Narrative   Financial assistance approved for 100% discount at [facility] and has [facility] card per [employer]   07/09/2010      Marital status: single; not dating currently      Children:  1 child (58)      Lives: with daughter.   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      Tobacco: none      Alcohol: 4 times per week; 3 glasses wine      Drugs: none      Exercise: walking at work.       Allergies  Allergen Reactions  . Codeine Hives  . Latex Hives  . Penicillins Hives    Has patient had a PCN reaction causing immediate rash, facial/tongue/throat swelling, SOB or lightheadedness with hypotension: No Has patient had a PCN reaction causing severe rash involving mucus membranes or skin necrosis: No Has patient had a PCN reaction that required hospitalization No Has patient had a PCN reaction occurring within the last 10 years: No If all of the above answers are "NO", then may proceed with Cephalosporin use.   . Sulfamethoxazole-Trimethoprim Other (See Comments)    REACTION: bumps under skin    No current facility-administered medications on file prior to encounter.   Current Outpatient Prescriptions on File Prior to Encounter  Medication  Sig Dispense Refill  . Prenatal Vit-Fe Fumarate-FA (PRENATAL MULTIVITAMIN) TABS tablet Take 1 tablet by mouth daily at 12 noon.    . metroNIDAZOLE (FLAGYL) 500 MG tablet Take 1 tablet (500 mg total) by mouth 2 (two) times daily. (Patient not taking: Reported on 08/21/2015) 14 tablet 0     Review of Systems  Eyes: Negative for visual disturbance.  Gastrointestinal: Negative for abdominal pain.  Genitourinary: Negative for vaginal bleeding, vaginal discharge and pelvic pain.  Musculoskeletal:       Feet and hand swelling and pain  Neurological: Negative for dizziness and headaches.  All other systems reviewed and are negative.    Physical Exam   Filed Vitals:   09/28/15 0022  BP: 116/83  Pulse: 80  Temp: 98.1 F (36.7 C)  TempSrc: Oral  Resp: 16  Height:  (1.473 m)  Weight: 137 lb (62.143 kg)  SpO2: 97%    Physical Exam  Constitutional: She is oriented to person, place, and time. She appears well-developed and  well-nourished.  HENT:  Head: Normocephalic.  Neck: Normal range of motion. Neck supple.  Cardiovascular: Normal rate, regular rhythm and normal heart sounds.   Respiratory: Effort normal and breath sounds normal. No respiratory distress.  GI: Soft. There is no tenderness.  Genitourinary: No bleeding in the vagina. Vaginal discharge (mucusy) found.  Musculoskeletal: Normal range of motion. She exhibits edema (trace pedal).  Hands and feet of normal color  Neurological: She is alert and oriented to person, place, and time.  Skin: Skin is warm and dry.   Dilation: Fingertip Effacement (%): Thick Cervical Position: Posterior Exam by:: Margarita Mail, CNM   FHR 130's, +accel Toco 5-7 (not felt by patient) MAU Course  Procedures  MDM [phone] Consulted with Dr. Dion Body > Reviewed HPI/Exam/cervical exam/fetal tracing/contractions not felt by patient > discharge home in 20 minutes  Assessment and Plan  40 y.o. G2P1001 at [redacted]w[redacted]d IUP  Swelling in Pregnancy - normal exam Braxton Hick's  Reactive NST  Plan: Discharge home Reviewed preterm labor precautions Recommended elevation of feet when possible; compression socks since increased standing at work Keep scheduled appt   Marlis Edelson, CNM 09/28/2015 1:54 AM

## 2015-09-28 NOTE — MAU Note (Signed)
Pt reports swelling in her hands and feet, has been happening but it is getting worse. Also reports pain in her hands and feet. Denies headache or blurred vision.

## 2015-10-21 NOTE — H&P (Signed)
HPI: 40 y/o G2P1001 @ 553w0d estimated gestational age (as dated by LMP c/w 20 week ultrasound) presents for repeat C-section due to transverse lie and prior C-section.   no Leaking of Fluid,   no Vaginal Bleeding,   no Uterine Contractions,  + Fetal Movement.  ROS: no HA, no epigastric pain, no visual changes.    Pregnancy complicated by: 1) GDMA1- controlled by diet.  Last US performed on 3/21: Transverse lie  -NST weekly and growth q 4wks 2) Prior C-section for arrest of descent complicated by preeclampsia/HTN 3) Transverse presentation: @ 4356w6d: Transverse- head maternal right, spine down, EFW: 7#7oz (67%)   Prenatal Transfer Tool  Maternal Diabetes: Yes:  Diabetes Type:  Diet controlled Genetic Screening: Normal Maternal Ultrasounds/Referrals: Normal Fetal Ultrasounds or other Referrals:  None Maternal Substance Abuse:  No Significant Maternal Medications:  None Significant Maternal Lab Results: Lab values include: Group B Strep negative   PNL:  GBS negative, Rub Immune, Hep B neg, RPR NR, HIV neg, GC/C neg, glucola:abnormal Hgb: 11.9 Blood type: A positive, antibody neg  OBHx: 2000: primary C-section, 8#13oz, arrest of descent, complicated by preeclampsia PMHx:  none Meds:  PNV Allergy:   Allergies  Allergen Reactions  . Codeine Hives  . Latex Hives  . Penicillins Hives    Has patient had a PCN reaction causing immediate rash, facial/tongue/throat swelling, SOB or lightheadedness with hypotension: No Has patient had a PCN reaction causing severe rash involving mucus membranes or skin necrosis: No Has patient had a PCN reaction that required hospitalization No Has patient had a PCN reaction occurring within the last 10 years: No If all of the above answers are "NO", then may proceed with Cephalosporin use.   Virgel Gess. Sulfamethoxazole-Trimethoprim Other (See Comments)    REACTION: bumps under skin   SurgHx: C-section x 1 SocHx:   no Tobacco, no  EtOH, no Illicit Drugs  O: to  be obtained on arrival Gen. AAOx3, NAD CV.  RRR  No murmur.  Resp. CTAB, no wheeze or crackles. Abd. Gravid,  no tenderness,  no rigidity,  no guarding Extr.  no edema B/L , no calf tenderness  FHT: 135 by doppler in office Last US: 3/21: Transverse lie/spine inferior, 7#7oz (67%)   Labs: see orders  A/P:  40 y.o. G2P1001 @ 5953w0d EGA who presents for repeat C-section due to fetal malpresentation -FWB:  Reassuring by doppler -NPO -LR @125cc /hr -GDMA1: accucheck upon arrival -SCDs to OR -ALL PCN: plan for D.R. Horton, Incent/Clinda  Sara Mersedes Alber, DO [phone] (pager) [phone] (office)

## 2015-10-22 ENCOUNTER — Encounter (HOSPITAL_COMMUNITY): Payer: Self-pay

## 2015-10-22 ENCOUNTER — Telehealth (HOSPITAL_COMMUNITY): Payer: Self-pay | Admitting: *Deleted

## 2015-10-22 ENCOUNTER — Encounter (HOSPITAL_COMMUNITY): Payer: Self-pay | Admitting: *Deleted

## 2015-10-22 NOTE — Telephone Encounter (Signed)
Preadmission screen  

## 2015-10-27 ENCOUNTER — Encounter (HOSPITAL_COMMUNITY)
Admission: RE | Admit: 2015-10-27 | Discharge: 2015-10-27 | Disposition: A | Discharge status: Home / Self Care | Payer: Medicaid Other | Source: Ambulatory Visit | Attending: Obstetrics & Gynecology | Admitting: Obstetrics & Gynecology

## 2015-10-27 LAB — CBC
HCT: 37.5 % (ref 36.0–46.0)
Hemoglobin: 12.9 g/dL (ref 12.0–15.0)
MCH: 30.6 pg (ref 26.0–34.0)
MCHC: 34.4 g/dL (ref 30.0–36.0)
MCV: 89.1 fL (ref 78.0–100.0)
PLATELETS: 217 10*3/uL (ref 150–400)
RBC: 4.21 MIL/uL (ref 3.87–5.11)
RDW: 13.7 % (ref 11.5–15.5)
WBC: 9.1 10*3/uL (ref 4.0–10.5)

## 2015-10-27 MED ORDER — GENTAMICIN SULFATE 40 MG/ML IJ SOLN
INTRAMUSCULAR | Status: AC
  Administered 2015-10-28: 100 mL via INTRAVENOUS
  Filled 2015-10-27: qty 7.75

## 2015-10-27 NOTE — Anesthesia Preprocedure Evaluation (Addendum)
Anesthesia Evaluation  Patient identified by MRN, date of birth, ID band Patient awake    Reviewed: Allergy & Precautions, NPO status , Patient's Chart, lab work & pertinent test results  Airway Mallampati: III  TM Distance: >3 FB Neck ROM: Full    Dental  (+) Dental Advisory Given   Pulmonary former smoker,    breath sounds clear to auscultation       Cardiovascular negative cardio ROS   Rhythm:Regular Rate:Normal     Neuro/Psych negative neurological ROS     GI/Hepatic negative GI ROS, Neg liver ROS,   Endo/Other  diabetes (GDM)  Renal/GU negative Renal ROS     Musculoskeletal   Abdominal   Peds  Hematology negative hematology ROS (+)   Anesthesia Other Findings   Reproductive/Obstetrics (+) Pregnancy                            Lab Results  Component Value Date   WBC 9.1 10/27/2015   HGB 12.9 10/27/2015   HCT 37.5 10/27/2015   MCV 89.1 10/27/2015   PLT 217 10/27/2015   Lab Results  Component Value Date   CREATININE 0.66 09/17/2014   BUN 7 09/17/2014   NA 137 09/17/2014   K 4.2 09/17/2014   CL 104 09/17/2014   CO2 24 09/17/2014    Anesthesia Physical Anesthesia Plan  ASA: II  Anesthesia Plan: Spinal   Post-op Pain Management:    Induction:   Airway Management Planned: Natural Airway  Additional Equipment:   Intra-op Plan:   Post-operative Plan:   Informed Consent: I have reviewed the patients History and Physical, chart, labs and discussed the procedure including the risks, benefits and alternatives for the proposed anesthesia with the patient or authorized representative who has indicated his/her understanding and acceptance.     Plan Discussed with: CRNA  Anesthesia Plan Comments:        Anesthesia Quick Evaluation

## 2015-10-28 ENCOUNTER — Inpatient Hospital Stay (HOSPITAL_COMMUNITY)
Admission: RE | Admit: 2015-10-28 | Discharge: 2015-10-30 | DRG: 766 | Disposition: A | Discharge status: Home / Self Care | Payer: Medicaid Other | Source: Ambulatory Visit | Attending: Obstetrics & Gynecology | Admitting: Obstetrics & Gynecology

## 2015-10-28 ENCOUNTER — Encounter (HOSPITAL_COMMUNITY): Payer: Self-pay

## 2015-10-28 ENCOUNTER — Inpatient Hospital Stay (HOSPITAL_COMMUNITY): Payer: Medicaid Other | Admitting: Anesthesiology

## 2015-10-28 ENCOUNTER — Encounter (HOSPITAL_COMMUNITY)
Admission: RE | Disposition: A | Discharge status: Home / Self Care | Payer: Self-pay | Source: Ambulatory Visit | Attending: Obstetrics & Gynecology

## 2015-10-28 DIAGNOSIS — O24420 Gestational diabetes mellitus in childbirth, diet controlled: Secondary | ICD-10-CM | POA: Diagnosis present

## 2015-10-28 DIAGNOSIS — Z3A39 39 weeks gestation of pregnancy: Secondary | ICD-10-CM | POA: Diagnosis not present

## 2015-10-28 DIAGNOSIS — O322XX0 Maternal care for transverse and oblique lie, not applicable or unspecified: Principal | ICD-10-CM | POA: Diagnosis present

## 2015-10-28 DIAGNOSIS — O34211 Maternal care for low transverse scar from previous cesarean delivery: Secondary | ICD-10-CM | POA: Diagnosis not present

## 2015-10-28 DIAGNOSIS — Z87891 Personal history of nicotine dependence: Secondary | ICD-10-CM

## 2015-10-28 DIAGNOSIS — Z3490 Encounter for supervision of normal pregnancy, unspecified, unspecified trimester: Secondary | ICD-10-CM

## 2015-10-28 LAB — RPR: RPR: NONREACTIVE

## 2015-10-28 LAB — PREPARE RBC (CROSSMATCH)

## 2015-10-28 SURGERY — Surgical Case
Anesthesia: Spinal

## 2015-10-28 MED ORDER — DIPHENHYDRAMINE HCL 25 MG PO CAPS
25.0000 mg | ORAL_CAPSULE | ORAL | Status: DC | PRN
  Administered 2015-10-29: 25 mg via ORAL
  Filled 2015-10-28 (×2): qty 1

## 2015-10-28 MED ORDER — LACTATED RINGERS IV SOLN
INTRAVENOUS | Status: DC
  Administered 2015-10-28 (×2): via INTRAVENOUS

## 2015-10-28 MED ORDER — IBUPROFEN 600 MG PO TABS
600.0000 mg | ORAL_TABLET | Freq: Four times a day (QID) | ORAL | Status: DC
  Administered 2015-10-28 – 2015-10-30 (×7): 600 mg via ORAL
  Filled 2015-10-28 (×7): qty 1

## 2015-10-28 MED ORDER — PHENYLEPHRINE 8 MG IN D5W 100 ML (0.08MG/ML) PREMIX OPTIME
INJECTION | INTRAVENOUS | Status: DC | PRN
  Administered 2015-10-28: 60 ug/min via INTRAVENOUS

## 2015-10-28 MED ORDER — MIDAZOLAM HCL 2 MG/2ML IJ SOLN
INTRAMUSCULAR | Status: AC
  Filled 2015-10-28: qty 2

## 2015-10-28 MED ORDER — TETANUS-DIPHTH-ACELL PERTUSSIS 5-2.5-18.5 LF-MCG/0.5 IM SUSP: 0.5000 mL | Freq: Once | INTRAMUSCULAR | Status: DC

## 2015-10-28 MED ORDER — KETOROLAC TROMETHAMINE 30 MG/ML IJ SOLN
30.0000 mg | Freq: Four times a day (QID) | INTRAMUSCULAR | Status: AC | PRN
Start: 2015-10-28 — End: 2015-10-29
  Administered 2015-10-28: 30 mg via INTRAVENOUS
  Filled 2015-10-28: qty 1

## 2015-10-28 MED ORDER — LANOLIN HYDROUS EX OINT: 1.0000 "application " | TOPICAL_OINTMENT | CUTANEOUS | Status: DC | PRN

## 2015-10-28 MED ORDER — KETOROLAC TROMETHAMINE 30 MG/ML IJ SOLN
30.0000 mg | Freq: Four times a day (QID) | INTRAMUSCULAR | Status: AC | PRN
  Administered 2015-10-28: 30 mg via INTRAMUSCULAR

## 2015-10-28 MED ORDER — KETOROLAC TROMETHAMINE 30 MG/ML IJ SOLN
INTRAMUSCULAR | Status: AC
Start: 2015-10-28 — End: 2015-10-28
  Administered 2015-10-28: 30 mg via INTRAMUSCULAR
  Filled 2015-10-28: qty 1

## 2015-10-28 MED ORDER — DEXTROSE 5 % IV SOLN
1.0000 ug/kg/h | INTRAVENOUS | Status: DC | PRN
  Filled 2015-10-28: qty 2

## 2015-10-28 MED ORDER — FENTANYL CITRATE (PF) 100 MCG/2ML IJ SOLN
INTRAMUSCULAR | Status: DC | PRN
  Administered 2015-10-28: 90 ug via INTRAVENOUS
  Administered 2015-10-28: 10 ug via INTRAVENOUS

## 2015-10-28 MED ORDER — OXYTOCIN 10 UNIT/ML IJ SOLN
INTRAMUSCULAR | Status: AC
  Filled 2015-10-28: qty 4

## 2015-10-28 MED ORDER — PRENATAL MULTIVITAMIN CH
1.0000 | ORAL_TABLET | Freq: Every day | ORAL | Status: DC
  Administered 2015-10-29: 1 via ORAL
  Filled 2015-10-28 (×2): qty 1

## 2015-10-28 MED ORDER — LACTATED RINGERS IV SOLN
40.0000 [IU] | INTRAVENOUS | Status: DC | PRN
  Administered 2015-10-28: 40 [IU] via INTRAVENOUS

## 2015-10-28 MED ORDER — NALBUPHINE HCL 10 MG/ML IJ SOLN
5.0000 mg | INTRAMUSCULAR | Status: DC | PRN
  Administered 2015-10-28 (×2): 5 mg via SUBCUTANEOUS
  Filled 2015-10-28 (×2): qty 1

## 2015-10-28 MED ORDER — LACTATED RINGERS IV SOLN
INTRAVENOUS | Status: DC
  Administered 2015-10-28: 08:00:00 via INTRAVENOUS

## 2015-10-28 MED ORDER — NALBUPHINE HCL 10 MG/ML IJ SOLN: 5.0000 mg | Freq: Once | INTRAMUSCULAR | Status: DC | PRN

## 2015-10-28 MED ORDER — PHENYLEPHRINE 40 MCG/ML (10ML) SYRINGE FOR IV PUSH (FOR BLOOD PRESSURE SUPPORT)
PREFILLED_SYRINGE | INTRAVENOUS | Status: AC
  Filled 2015-10-28: qty 10

## 2015-10-28 MED ORDER — FENTANYL CITRATE (PF) 100 MCG/2ML IJ SOLN
INTRAMUSCULAR | Status: AC
  Filled 2015-10-28: qty 2

## 2015-10-28 MED ORDER — ONDANSETRON HCL 4 MG/2ML IJ SOLN
INTRAMUSCULAR | Status: AC
  Filled 2015-10-28: qty 2

## 2015-10-28 MED ORDER — WITCH HAZEL-GLYCERIN EX PADS: 1.0000 "application " | MEDICATED_PAD | CUTANEOUS | Status: DC | PRN

## 2015-10-28 MED ORDER — ONDANSETRON HCL 4 MG/2ML IJ SOLN: 4.0000 mg | Freq: Three times a day (TID) | INTRAMUSCULAR | Status: DC | PRN

## 2015-10-28 MED ORDER — SCOPOLAMINE 1 MG/3DAYS TD PT72
1.0000 | MEDICATED_PATCH | Freq: Once | TRANSDERMAL | Status: DC
  Administered 2015-10-28: 1.5 mg via TRANSDERMAL

## 2015-10-28 MED ORDER — MENTHOL 3 MG MT LOZG: 1.0000 | LOZENGE | OROMUCOSAL | Status: DC | PRN

## 2015-10-28 MED ORDER — NALBUPHINE HCL 10 MG/ML IJ SOLN
5.0000 mg | INTRAMUSCULAR | Status: DC | PRN
  Administered 2015-10-29: 5 mg via INTRAVENOUS
  Filled 2015-10-28: qty 1

## 2015-10-28 MED ORDER — OXYTOCIN 10 UNIT/ML IJ SOLN: 2.5000 [IU]/h | INTRAMUSCULAR | Status: AC

## 2015-10-28 MED ORDER — SODIUM CHLORIDE 0.9% FLUSH: 3.0000 mL | INTRAVENOUS | Status: DC | PRN

## 2015-10-28 MED ORDER — OXYCODONE HCL 5 MG PO TABS
5.0000 mg | ORAL_TABLET | ORAL | Status: DC | PRN
  Administered 2015-10-29 – 2015-10-30 (×3): 5 mg via ORAL
  Filled 2015-10-28 (×3): qty 1

## 2015-10-28 MED ORDER — NALOXONE HCL 0.4 MG/ML IJ SOLN: 0.4000 mg | INTRAMUSCULAR | Status: DC | PRN

## 2015-10-28 MED ORDER — PHENYLEPHRINE 8 MG IN D5W 100 ML (0.08MG/ML) PREMIX OPTIME
INJECTION | INTRAVENOUS | Status: AC
  Filled 2015-10-28: qty 100

## 2015-10-28 MED ORDER — DIBUCAINE 1 % RE OINT: 1.0000 "application " | TOPICAL_OINTMENT | RECTAL | Status: DC | PRN

## 2015-10-28 MED ORDER — DEXAMETHASONE SODIUM PHOSPHATE 4 MG/ML IJ SOLN
INTRAMUSCULAR | Status: DC | PRN
Start: 2015-10-28 — End: 2015-10-28
  Administered 2015-10-28: 4 mg via INTRAVENOUS

## 2015-10-28 MED ORDER — MIDAZOLAM HCL 2 MG/2ML IJ SOLN
INTRAMUSCULAR | Status: DC | PRN
  Administered 2015-10-28: 2 mg via INTRAVENOUS

## 2015-10-28 MED ORDER — ONDANSETRON HCL 4 MG/2ML IJ SOLN
INTRAMUSCULAR | Status: DC | PRN
Start: 2015-10-28 — End: 2015-10-28
  Administered 2015-10-28: 4 mg via INTRAVENOUS

## 2015-10-28 MED ORDER — MORPHINE SULFATE (PF) 0.5 MG/ML IJ SOLN
INTRAMUSCULAR | Status: DC | PRN
  Administered 2015-10-28: .2 mg via EPIDURAL

## 2015-10-28 MED ORDER — SIMETHICONE 80 MG PO CHEW
80.0000 mg | CHEWABLE_TABLET | Freq: Three times a day (TID) | ORAL | Status: DC
  Administered 2015-10-28 – 2015-10-30 (×5): 80 mg via ORAL
  Filled 2015-10-28 (×4): qty 1

## 2015-10-28 MED ORDER — PNEUMOCOCCAL VAC POLYVALENT 25 MCG/0.5ML IJ INJ
0.5000 mL | INJECTION | INTRAMUSCULAR | Status: AC
  Administered 2015-10-30: 0.5 mL via INTRAMUSCULAR
  Filled 2015-10-28: qty 0.5

## 2015-10-28 MED ORDER — MORPHINE SULFATE (PF) 0.5 MG/ML IJ SOLN
INTRAMUSCULAR | Status: AC
  Filled 2015-10-28: qty 10

## 2015-10-28 MED ORDER — DIPHENHYDRAMINE HCL 50 MG/ML IJ SOLN
12.5000 mg | INTRAMUSCULAR | Status: DC | PRN
  Filled 2015-10-28: qty 1

## 2015-10-28 MED ORDER — SIMETHICONE 80 MG PO CHEW
80.0000 mg | CHEWABLE_TABLET | ORAL | Status: DC
  Administered 2015-10-28 – 2015-10-29 (×2): 80 mg via ORAL
  Filled 2015-10-28 (×2): qty 1

## 2015-10-28 MED ORDER — DIPHENHYDRAMINE HCL 25 MG PO CAPS
25.0000 mg | ORAL_CAPSULE | Freq: Four times a day (QID) | ORAL | Status: DC | PRN
Start: 2015-10-28 — End: 2015-10-30
  Filled 2015-10-28: qty 1

## 2015-10-28 MED ORDER — MEPERIDINE HCL 25 MG/ML IJ SOLN: 6.2500 mg | INTRAMUSCULAR | Status: DC | PRN

## 2015-10-28 MED ORDER — SIMETHICONE 80 MG PO CHEW: 80.0000 mg | CHEWABLE_TABLET | ORAL | Status: DC | PRN

## 2015-10-28 MED ORDER — SCOPOLAMINE 1 MG/3DAYS TD PT72
MEDICATED_PATCH | TRANSDERMAL | Status: AC
  Administered 2015-10-28: 1.5 mg via TRANSDERMAL
  Filled 2015-10-28: qty 1

## 2015-10-28 MED ORDER — SCOPOLAMINE 1 MG/3DAYS TD PT72: 1.0000 | MEDICATED_PATCH | Freq: Once | TRANSDERMAL | Status: DC

## 2015-10-28 MED ORDER — OXYCODONE HCL 5 MG PO TABS
10.0000 mg | ORAL_TABLET | ORAL | Status: DC | PRN
  Filled 2015-10-28: qty 2

## 2015-10-28 MED ORDER — SODIUM CHLORIDE 0.9 % IR SOLN
Status: DC | PRN
Start: 2015-10-28 — End: 2015-10-28
  Administered 2015-10-28: 1000 mL

## 2015-10-28 MED ORDER — ZOLPIDEM TARTRATE 5 MG PO TABS: 5.0000 mg | ORAL_TABLET | Freq: Every evening | ORAL | Status: DC | PRN

## 2015-10-28 MED ORDER — ACETAMINOPHEN 325 MG PO TABS: 650.0000 mg | ORAL_TABLET | ORAL | Status: DC | PRN

## 2015-10-28 MED ORDER — SENNOSIDES-DOCUSATE SODIUM 8.6-50 MG PO TABS
2.0000 | ORAL_TABLET | ORAL | Status: DC
  Administered 2015-10-28 – 2015-10-29 (×2): 2 via ORAL
  Filled 2015-10-28 (×2): qty 2

## 2015-10-28 MED ORDER — BUPIVACAINE IN DEXTROSE 0.75-8.25 % IT SOLN
INTRATHECAL | Status: DC | PRN
  Administered 2015-10-28: 1.4 mL via INTRATHECAL

## 2015-10-28 MED ORDER — LACTATED RINGERS IV SOLN
INTRAVENOUS | Status: DC
  Administered 2015-10-28 (×2): via INTRAVENOUS

## 2015-10-28 MED ORDER — LACTATED RINGERS IV SOLN
Freq: Once | INTRAVENOUS | Status: AC
  Administered 2015-10-28: 07:00:00 via INTRAVENOUS

## 2015-10-28 SURGICAL SUPPLY — 38 items
APL SKNCLS STERI-STRIP NONHPOA (GAUZE/BANDAGES/DRESSINGS) ×1
BARRIER ADHS 3X4 INTERCEED (GAUZE/BANDAGES/DRESSINGS) ×2 IMPLANT
BENZOIN TINCTURE PRP APPL 2/3 (GAUZE/BANDAGES/DRESSINGS) ×2 IMPLANT
BRR ADH 4X3 ABS CNTRL BYND (GAUZE/BANDAGES/DRESSINGS) ×1
CHLORAPREP W/TINT 26ML (MISCELLANEOUS) ×2 IMPLANT
CLAMP CORD UMBIL (MISCELLANEOUS) IMPLANT
CLOTH BEACON ORANGE TIMEOUT ST (SAFETY) ×2 IMPLANT
DRSG OPSITE POSTOP 4X10 (GAUZE/BANDAGES/DRESSINGS) ×2 IMPLANT
ELECT REM PT RETURN 9FT ADLT (ELECTROSURGICAL) ×2
ELECTRODE REM PT RTRN 9FT ADLT (ELECTROSURGICAL) ×1 IMPLANT
EXTRACTOR VACUUM KIWI (MISCELLANEOUS) IMPLANT
GLOVE BIOGEL PI IND STRL 6.5 (GLOVE) ×1 IMPLANT
GLOVE BIOGEL PI IND STRL 7.0 (GLOVE) ×2 IMPLANT
GLOVE BIOGEL PI INDICATOR 6.5 (GLOVE) ×1
GLOVE BIOGEL PI INDICATOR 7.0 (GLOVE) ×2
GLOVE ECLIPSE 6.5 STRL STRAW (GLOVE) ×2 IMPLANT
GOWN STRL REUS W/TWL LRG LVL3 (GOWN DISPOSABLE) ×6 IMPLANT
KIT ABG SYR 3ML LUER SLIP (SYRINGE) IMPLANT
LIQUID BAND (GAUZE/BANDAGES/DRESSINGS) IMPLANT
NEEDLE HYPO 25X5/8 SAFETYGLIDE (NEEDLE) IMPLANT
NS IRRIG 1000ML POUR BTL (IV SOLUTION) ×2 IMPLANT
PACK C SECTION WH (CUSTOM PROCEDURE TRAY) ×2 IMPLANT
PAD ABD 7.5X8 STRL (GAUZE/BANDAGES/DRESSINGS) ×2 IMPLANT
PAD OB MATERNITY 4.3X12.25 (PERSONAL CARE ITEMS) ×2 IMPLANT
PENCIL SMOKE EVAC W/HOLSTER (ELECTROSURGICAL) ×2 IMPLANT
RTRCTR C-SECT PINK 25CM LRG (MISCELLANEOUS) ×2 IMPLANT
STRIP CLOSURE SKIN 1/2X4 (GAUZE/BANDAGES/DRESSINGS) ×2 IMPLANT
SUT PLAIN 0 NONE (SUTURE) IMPLANT
SUT PLAIN 2 0 XLH (SUTURE) IMPLANT
SUT VIC AB 0 CT1 27 (SUTURE) ×2
SUT VIC AB 0 CT1 27XBRD ANBCTR (SUTURE) ×2 IMPLANT
SUT VIC AB 0 CTX 36 (SUTURE) ×3
SUT VIC AB 0 CTX36XBRD ANBCTRL (SUTURE) ×3 IMPLANT
SUT VIC AB 2-0 CT1 27 (SUTURE) ×1
SUT VIC AB 2-0 CT1 TAPERPNT 27 (SUTURE) ×1 IMPLANT
SUT VIC AB 4-0 KS 27 (SUTURE) ×2 IMPLANT
TOWEL OR 17X24 6PK STRL BLUE (TOWEL DISPOSABLE) ×2 IMPLANT
TRAY FOLEY CATH SILVER 14FR (SET/KITS/TRAYS/PACK) IMPLANT

## 2015-10-28 NOTE — Progress Notes (Addendum)
Patient extremely anxious about baby not eating and spitting up as well as every aspect of her care.  Reassured her that most babies do not eat well the first day, especially when they have mucous and meconium in their abdomen to be excreted. Baby's blood sugars were good and have encouraged mother to do skin to skin. Baby has been extremely spitty. Upon entering room when family called out, FOB was giving baby back blows during spitty episode. Neonatologist happened to be in hallway and went in room also to be sure things were okay. Baby slightly dusky but became pink with bulb syringe suction and back blows. Baby bearing down to have stool also. Visitor told FOB that we needed the baby on the monitor. Neonatologist explained that only babies in the NICU are monitored. Baby crying and breathing well. Slight nasal stuffiness in bilateral nares. Reassured them once again that this spittiness and nasal stuffiness is normal especially after a cesarean section. Mother continues to state multiple times that she had a c-section with her 39 year old and she was never spitty.  Reassured her that every baby is different and it all depends on how much fluid the baby has on his stomach. FOB questions how they would hear the baby choking while they are asleep and what should they do. Encouraged mother and father that while the baby is trying to get all the mucous out, to take turns holding the baby sitting upright and keeping an eye on him while the other sleeps for a bit. While neonatologist was finishing explaining that we would continue to monitor the baby while in the room, the grandmother of the baby interrupted him to introduce visitor to the FOB. The neonatologist proceeds to state he would leave and continue his rounds, although the family and patient does not seem to be listening to him at this point but more concerned with the visitor.  As nurse is cleaning up baby and attempting to change baby's linens, FOB,  grandmother and visitor seem to be trying to push through past crib and nurse as if nurse is just in the way, instead of being concerned with the baby anymore. The grandmother asks the visitor if she would like to hold the baby; however, the visitor states that maybe we should make sure the baby is completely okay first. Baby's diaper and linens changed and breathing fine. Swaddled baby and given to FOB. Encouraged to push emergency bell if the baby has spitty episode again. Will continue to monitor. Chelsey Kimberley Hudspeth  

## 2015-10-28 NOTE — Lactation Note (Signed)
This note was copied from a baby's chart. Lactation Consultation Note  Patient Name: Sara Ledell PeoplesJennifer Bellerose RUEAV'WToday's Date: 10/28/2015 Reason for consult: Initial assessment Attempted to latch baby at this visit but baby has been spitty and not interested in BF at this time. Baby spit up small amount of clear mucous at this visit. Mom has good amount of colostrum present with hand expression, baby took few drops with trying to latch. Basic teaching reviewed with Mom, but Mom very sleepy as well, will need review. FOB present as well. Encouraged to BF with feeding ques. Reviewed tummy sizes and normal newborn behaviors in the 1st 24 hours. Encouraged to call for assist with next feeding till baby is latching. Mom's 1st time BF. Lactation brochure left for review, advised of OP services and support group.   Maternal Data    Feeding Feeding Type: Breast Fed Length of feed: 0 min  LATCH Score/Interventions Latch: Too sleepy or reluctant, no latch achieved, no sucking elicited.                    Lactation Tools Discussed/Used WIC Program: Yes   Consult Status Consult Status: Follow-up Date: 10/28/15 Follow-up type: In-patient    Alfred LevinsGranger, Wendelyn Kiesling Ann 10/28/2015, 4:54 PM

## 2015-10-28 NOTE — Consult Note (Signed)
[facility]  Delivery Note:  C-section       10/28/2015  7:45 AM  I was called to the operating room at the request of the patient's obstetrician (Dr. Ozan) for a repeat c-section.  PRENATAL HX:  This is a 39 y/o G2P1001 at 39 and 0/[redacted] weeks gestation who was admitted for a repeat c-section for transverse lie.  Her pregnancy has been complicated by diet controlled GDM.    INTRAPARTUM HX:   Repeat c-section with AROM at delivery  DELIVERY:  Infant was vigorous at delivery, requiring no resuscitation other than standard warming, drying and stimulation.  APGARs 8 and 9.  Exam within normal limits.  After 5 minutes, baby left with nurse to assist parents with skin-to-skin care.   _____________________ Electronically Signed By: Maylen Waltermire, MD Neonatologist 

## 2015-10-28 NOTE — Anesthesia Procedure Notes (Signed)
Spinal Patient location during procedure: OR Staffing Anesthesiologist: Marcene DuosFITZGERALD, Terri Rorrer Performed by: anesthesiologist  Preanesthetic Checklist Completed: patient identified, site marked, surgical consent, pre-op evaluation, timeout performed, IV checked, risks and benefits discussed and monitors and equipment checked Spinal Block Patient position: sitting Prep: DuraPrep Patient monitoring: heart rate, continuous pulse ox and blood pressure Approach: midline Location: L4-5 Injection technique: single-shot Needle Needle type: Pencan  Needle gauge: 24 G Needle length: 9 cm Assessment Sensory level: T6

## 2015-10-28 NOTE — Progress Notes (Signed)
UR chart review completed.  

## 2015-10-28 NOTE — Progress Notes (Signed)
Father, then mother held baby s2s from [phone] [id number].  Attempt to have baby suckle at breast was unsuccessful, due to baby's uncoordinated(?) suck, so while mom held baby s2s, handexpressed 1 ml colostrum, and while dad held, fed using foley cup.  Baby slowing becoming more alert, opening eyes; still spitty, but not to extent earlier. Mom understands importance of putting to breast/s2s q2-3 hours.  Had ordered food; very tired.

## 2015-10-28 NOTE — Anesthesia Postprocedure Evaluation (Signed)
Anesthesia Post Note  Patient: Sara Montoya  Procedure(s) Performed: Procedure(s) (LRB): CESAREAN SECTION (N/A)  Patient location during evaluation: Mother Baby Anesthesia Type: Spinal Level of consciousness: oriented and awake and alert Pain management: pain level controlled Vital Signs Assessment: post-procedure vital signs reviewed and stable Respiratory status: spontaneous breathing and respiratory function stable Cardiovascular status: blood pressure returned to baseline and stable Postop Assessment: no headache and no backache Anesthetic complications: no    Last Vitals:  Filed Vitals:   10/28/15 1200 10/28/15 1300  BP: 106/58 93/57  Pulse: 71 63  Temp: 36.8 C 36.8 C  Resp: 16 18    Last Pain:  Filed Vitals:   10/28/15 1337  PainSc: 0-No pain                 Keone Kamer

## 2015-10-28 NOTE — Interval H&P Note (Signed)
History and Physical Interval Note:  10/28/2015 6:30 AM  Sara KaufmannJennifer L Mahrt  has presented today for surgery, with the diagnosis of Z98.891 History of Cesarean Section  The various methods of treatment have been discussed with the patient and family. After consideration of risks, benefits and other options for treatment, the patient has consented to  Procedure(s) with comments: CESAREAN SECTION (N/A) - EDD 11/04/15 as a surgical intervention .  The patient's history has been reviewed, patient examined, no change in status, stable for surgery.  I have reviewed the patient's chart and labs.  Questions were answered to the patient's satisfaction.     [name], [place]anchon, M

## 2015-10-28 NOTE — Addendum Note (Signed)
Addendum  created 10/28/15 1424 by Junious SilkMelinda Darryn Kydd, CRNA   Modules edited: Clinical Notes   Clinical Notes:  File: [id number]

## 2015-10-28 NOTE — Anesthesia Postprocedure Evaluation (Signed)
Anesthesia Post Note  Patient: Sara Montoya  Procedure(s) Performed: Procedure(s) (LRB): CESAREAN SECTION (N/A)  Patient location during evaluation: PACU Anesthesia Type: Spinal and MAC Level of consciousness: awake and alert Pain management: pain level controlled Vital Signs Assessment: post-procedure vital signs reviewed and stable Respiratory status: spontaneous breathing and respiratory function stable Cardiovascular status: blood pressure returned to baseline and stable Postop Assessment: spinal receding Anesthetic complications: no    Last Vitals:  Filed Vitals:   10/28/15 0945 10/28/15 1007  BP: 95/66 94/64  Pulse: 67 71  Temp:  36.7 C  Resp: 16 18    Last Pain:  Filed Vitals:   10/28/15 1007  PainSc: 5                  Kennieth RadFitzgerald, Antero Derosia E

## 2015-10-28 NOTE — Transfer of Care (Signed)
Immediate Anesthesia Transfer of Care Note  Patient: Sara KaufmannJennifer L Neuberger  Procedure(s) Performed: Procedure(s) with comments: CESAREAN SECTION (N/A) - EDD 11/04/15  Patient Location: PACU  Anesthesia Type:Spinal  Level of Consciousness: awake, alert  and oriented  Airway & Oxygen Therapy: Patient Spontanous Breathing  Post-op Assessment: Report given to RN and Post -op Vital signs reviewed and stable  Post vital signs: Reviewed and stable  Last Vitals:  Filed Vitals:   10/28/15 0611  BP: 117/80  Pulse: 109  Temp: 36.8 C  Resp: 20    Complications: No apparent anesthesia complications

## 2015-10-28 NOTE — Progress Notes (Signed)
MOB was referred for history of depression/anxiety.  Referral is screened out by Clinical Social Worker because none of the following criteria appear to apply: -History of anxiety/depression during this pregnancy, or of post-partum depression. - Diagnosis of anxiety and/or depression within last 3 years (onset as teenager) or -MOB's symptoms are currently being treated with medication and/or therapy.  Please contact the Clinical Social Worker if needs arise or upon MOB request.  

## 2015-10-28 NOTE — Op Note (Signed)
PreOp Diagnosis: 1) Fetal malpresentation 2) Prior C-section 3) Intrauterine pregnancy @ 8568w0d PostOp Diagnosis: same Procedure: Repeat LTCS Surgeon: Dr. Myna HidalgoJennifer Glendy Barsanti Assistant: Dr. Marylou FlesherBenita Varnado Anesthesia: spinal Complications: none EBL: 600cc UOP: 250cc Fluids: 2500cc  Findings: Female infant from oblique presentation, normal uterus tubes and ovaries bilaterally.  PROCEDURE:  Informed consent was obtained from the patient with risks, benefits, complications, treatment options, and expected outcomes discussed with the patient.  The patient concurred with the proposed plan, giving informed consent with form signed.   The patient was taken to Operating Room, and identified with the procedure verified as C-Section Delivery with Time Out. With induction of anesthesia, the patient was prepped and draped in the usual sterile fashion. A Pfannenstiel incision was made and carried down through the subcutaneous tissue to the fascia. The fascia was incised in the midline and extended transversely. The superior aspect of the fascial incision was grasped with Kochers elevated and the underlying muscle dissected off. The inferior aspect of the facial incision was in similar fashion, grasped elevated and rectus muscles dissected off. The peritoneum was identified and entered. Peritoneal incision was extended longitudinally. The utero-vesical peritoneal reflection was identified and incised transversely with the [facility] scissors, the incision extended laterally, the bladder flap created digitally.  The Alexis retractor was inserted. A low transverse uterine incision was made and the infants head delivered atraumatically. After the umbilical cord was clamped and cut cord blood was obtained for evaluation.   The placenta was removed intact and appeared normal. The uterine outline, tubes and ovaries appeared normal. The uterine incision was closed with running locked sutures of 0 Vicryl and a second layer of the same  stitch was used in an imbricating fashion.  Excellent hemostasis was obtained.  The pericolic gutters were then cleared of all clots and debris.  Interceed was placed over the hysterotomy. The peritoneum was closed in a running fashion. The fascia was then reapproximated with running sutures of 0 Vicryl.  The skin was closed with 4-0 vicryl in a subcuticular fashion.  Instrument, sponge, and needle counts were correct prior the abdominal closure and at the conclusion of the case. The patient was taken to recovery in stable condition.  Dr. Dion BodyVarnado was present to assist due to her history of prior C-section, risk of complications and no residents are available to our service.  Myna HidalgoJennifer Jaquayla Hege, DO [phone] (pager) [phone] (office)

## 2015-10-29 ENCOUNTER — Encounter (HOSPITAL_COMMUNITY): Payer: Self-pay | Admitting: Obstetrics & Gynecology

## 2015-10-29 LAB — CBC
HEMATOCRIT: 29.9 % — AB (ref 36.0–46.0)
HEMOGLOBIN: 10.2 g/dL — AB (ref 12.0–15.0)
MCH: 30.6 pg (ref 26.0–34.0)
MCHC: 34.1 g/dL (ref 30.0–36.0)
MCV: 89.8 fL (ref 78.0–100.0)
Platelets: 193 10*3/uL (ref 150–400)
RBC: 3.33 MIL/uL — ABNORMAL LOW (ref 3.87–5.11)
RDW: 13.6 % (ref 11.5–15.5)
WBC: 12 10*3/uL — ABNORMAL HIGH (ref 4.0–10.5)

## 2015-10-29 NOTE — Progress Notes (Signed)
Pt  Out in hall with baby and signicant  [facility]   Informed this is against hospital policy   And she will have to sign out AMA   HOUSE COVERAGE AND CHARGE NURSE notified   Pt back to room  Pt and FOB not happy with   Information given    FEELS she is being held  PRISIONER  In her room

## 2015-10-29 NOTE — Progress Notes (Signed)
CSW received consult from OB due to concerns regarding MOB and FOB's relationship and MOB's increased anxiety.  CSW attempted to meet with MOB to offer support and complete assessment, but she was in the shower at this time.  CSW spoke briefly to FOB, who was holding baby skin to skin on the couch.  FOB states they are doing well at this time and agreed to a visit at a later time. 

## 2015-10-29 NOTE — Lactation Note (Signed)
This note was copied from a baby's chart. Lactation Consultation Note: Mother had questions about infant not latching.  She has company in the room and states that infant fed 2 hours ago . She gave infant 10 ml of formula with a bottle.  Mother describes not getting any colostrum when she pumps with electric pump.  Mother advised to contnue to post pump every 2-3 hours and use hand pump when she goes home.  Advised mother to bottle feed infant after attempting to breastfeed and allow infant to keep practicing breastfeeding.  Infant is still having some spit up. Mother is concerned about multiple things gas and spitting. Mother does have high anxiety level.  Advised mother to allow father to bottle feed while she post pumps her breast after each feeding.   Patient Name: Sara Ledell PeoplesJennifer Ade ZOXWR'UToday's Date: 10/29/2015 Reason for consult: Follow-up assessment   Maternal Data    Feeding Feeding Type: Formula (per mom) Nipple Type: Slow - flow  LATCH Score/Interventions                      Lactation Tools Discussed/Used     Consult Status Consult Status: Follow-up Date: 10/29/15 Follow-up type: In-patient    Stevan BornKendrick, Devion Chriscoe [facility] 10/29/2015, 3:05 PM

## 2015-10-29 NOTE — Lactation Note (Signed)
This note was copied from a baby's chart. Lactation Consultation Note  Patient Name: Sara Ledell PeoplesJennifer Erman EAVWU'JToday's Date: 10/29/2015 Reason for consult: Follow-up assessment with this mom and baby, term and 26 hours old, and crying with hunger. Mom reports having low milk supply in the past, and on exam, she has soft, flat breast, but easily expressed drops of colostrum. . The baby was too fussy to latch, so mom agreed to bottle/formula feed for this feeding. I brought an SNS into the room, and asked mom to call me when the baby is next ready to feed, and I will set up the SNS for her. I also explained to her that pumping every 3 hours, despite not expressing much, increases her milk supply. I also advised mom to hand express after each pumping, and to feed even drops to the baby. Mom knows to call for questions/concerns.    Maternal Data    Feeding Feeding Type: Formula Nipple Type: Slow - flow  LATCH Score/Interventions Latch: Too sleepy or reluctant, no latch achieved, no sucking elicited. Intervention(s): Waking techniques                    Lactation Tools Discussed/Used Tools: Supplemental Nutrition System   Consult Status Consult Status: Follow-up Date: 10/29/15 Follow-up type: In-patient    Sara Montoya, Sara Montoya 10/29/2015, 11:23 AM

## 2015-10-29 NOTE — Progress Notes (Signed)
Postpartum Note Day # 1  S:  Patient resting comfortable in bed.  Pain controlled.  Tolerating general diet. Not sure about flatus, no BM.  Lochia moderate.  Ambulating without difficulty.  She denies n/v/f/c, SOB, or CP.  Pt plans on breastfeeding and bottle feeding.  O: Temp:  [98 F (36.7 C)-98.6 F (37 C)] 98.4 F (36.9 C) (03/30 0445) Pulse Rate:  [56-71] 56 (03/30 0445) Resp:  [15-23] 16 (03/30 0445) BP: (86-111)/(48-76) 88/61 mmHg (03/30 0445) SpO2:  [94 %-98 %] 96 % (03/30 0445)   Gen: A&Ox3, NAD CV: RRR, no MRG Resp: CTAB Abdomen: soft, NT, ND +BS Uterus: firm, non-tender, below umbilicus Incision: bandage with dried blood, no bleeding noted Ext: No edema, no calf tenderness bilaterally  Labs:  CBC Latest Ref Rng 10/29/2015 10/27/2015 08/04/2015  WBC 4.0 - 10.5 K/uL 12.0(H) 9.1 9.3  Hemoglobin 12.0 - 15.0 g/dL 10.2(L) 12.9 11.6(L)  Hematocrit 36.0 - 46.0 % 29.9(L) 37.5 34.0(L)  Platelets 150 - 400 K/uL 193 217 243    A/P: Pt is a 40 y.o. [id number] s/p repeat C-section, POD#1  - Pain well controlled -GU: Foley removed, pt to void this am -GI: Tolerating general diet -Activity: encouraged sitting up to chair and ambulation as tolerated -Prophylaxis: early ambulation -Labs: stable as above -Plan for outpatient baby boy circ -due to h/o Depression and social concerns- plan for social services consult  DISPO: Continue with routine postoperative care  Myna HidalgoJennifer Adalai Perl, DO [phone]) [phone] (office)

## 2015-10-29 NOTE — Lactation Note (Signed)
This note was copied from a baby's chart. Lactation Consultation Note Baby hasn't been interested in BF d/t frequent emesis. Baby was in bassinet when entered rm. Had clear emesis over top half of bed. Mom stated she thought she heard him spit up. Baby awake. Half way out of blankets. Re-swaddled. Discussed w/mom about pumping for stimulation and hand expressing since baby hasn't been interested in BF. Mom doesn't like hand expressing, states her breast are tender and doesn't want to hand express. Mom stated that she could pump. Then mom stated that she probably wont have milk because her sister didn't have milk so she will end up giving formula. Asked mom her wishes. Mom stated she might try pumping. Mom shown how to use DEBP & how to disassemble, clean, & reassemble parts. Mom knows to pump q3h for 15-20 min. Mom encouraged to feed baby 8-12 times/24 hours and with feeding cues. Educated about newborn behavior, STS, I&O, supply and demand. Mom kept saying she wanted the baby to eat, that he has to be hungry. I explained when baby's spit up like he is doing, they are not wanting to BF. Holding baby noted recessed chin. Attempted to suck train. Baby bit down. Attempted to give bottle per mom request, baby finally slowly took 10 ml formula. Baby spit up some of formula a little later. Discussed w/mom the need to BF and or formula feed once the baby wants to eat. Reviewed normal intake for newborns. Baby had very large black watery stool. Baby's abd. Slightly distended to sides. Mom pumped w/DEBP. Mom kept talking about giving the colostrum she is fixing to pump. Explained several times so mom wouldn't be disappointed about colostrum's consistency. Explained if she did pump anything that would be great, but if she didn't that was normal the first 24 hrs. Mom needs quite a bit of direction and re-peat of information explained.  Patient Name: Sara Ledell PeoplesJennifer Montoya ZOXWR'UToday's Date: 10/29/2015 Reason for consult: Follow-up  assessment;Infant < 6lbs   Maternal Data    Feeding Feeding Type: Formula Nipple Type: Slow - flow Length of feed: 0 min (more active, no sustained latch. s2s)  LATCH Score/Interventions Latch: Too sleepy or reluctant, no latch achieved, no sucking elicited.     Type of Nipple: Everted at rest and after stimulation  Comfort (Breast/Nipple): Soft / non-tender           Lactation Tools Discussed/Used Tools: Pump Breast pump type: Double-Electric Breast Pump Pump Review: Setup, frequency, and cleaning;Milk Storage Initiated by:: Peri JeffersonL. Karlene Southard RN Date initiated:: 10/29/15   Consult Status Date: 10/29/15 Follow-up type: In-patient    [name], Diamond NickelLAURA G 10/29/2015, 5:04 AM

## 2015-10-30 ENCOUNTER — Ambulatory Visit: Payer: Self-pay

## 2015-10-30 MED ORDER — OXYCODONE-ACETAMINOPHEN 5-325 MG PO TABS: 1.0000 | ORAL_TABLET | Freq: Four times a day (QID) | ORAL | Status: DC | PRN

## 2015-10-30 MED ORDER — DOCUSATE SODIUM 100 MG PO CAPS: 100.0000 mg | ORAL_CAPSULE | Freq: Two times a day (BID) | ORAL | Status: DC

## 2015-10-30 MED ORDER — IBUPROFEN 600 MG PO TABS
600.0000 mg | ORAL_TABLET | Freq: Four times a day (QID) | ORAL | Status: DC
Start: 2015-10-30 — End: 2017-01-06

## 2015-10-30 MED ORDER — OXYCODONE HCL 5 MG PO TABS: 5.0000 mg | ORAL_TABLET | ORAL | Status: DC | PRN

## 2015-10-30 NOTE — Progress Notes (Signed)
CSW met with MOB to complete Psychosocial Assessment.  No barriers to discharge identified.  Full documentation to follow.

## 2015-10-30 NOTE — Discharge Instructions (Signed)
Cesarean Delivery, Care After  Refer to this sheet in the next few weeks. These instructions provide you with information on caring for yourself after your procedure. Your health care provider may also give you specific instructions. Your treatment has been planned according to current medical practices, but problems sometimes occur. Call your health care provider if you have any problems or questions after you go home. HOME CARE INSTRUCTIONS  Only take over-the-counter or prescription medications as directed by your health care provider.  For pain management- please alternate between the Percocet and Motrin.  If the percocet is too strong, you may take OTC Tylenol instead.  Do not drink alcohol, especially if you are breastfeeding or taking medication to relieve pain.  Do not chew or smoke tobacco.  Continue to use good perineal care. Good perineal care includes:  Wiping your perineum from front to back.  Keeping your perineum clean.  Check your surgical cut (incision) daily for increased redness, drainage, swelling, or separation of skin.  Clean your incision gently with soap and water every day, and then pat it dry. If your health care provider says it is okay, leave the incision uncovered. Use a bandage (dressing) if the incision is draining fluid or appears irritated. If the adhesive strips across the incision do not fall off within 7 days, carefully peel them off.  Hug a pillow when coughing or sneezing until your incision is healed. This helps to relieve pain.  Do not use tampons or douche until your health care provider says it is okay.  Shower, wash your hair, and take tub baths as directed by your health care provider.  Wear a well-fitting bra that provides breast support.  Limit wearing support panties or control-top hose.  Drink enough fluids to keep your urine clear or pale yellow.  Eat high-fiber foods such as whole grain cereals and breads, brown rice, beans, and  fresh fruits and vegetables every day. These foods may help prevent or relieve constipation.  Resume activities such as climbing stairs, driving, lifting, exercising, or traveling as directed by your health care provider.  Talk to your health care provider about resuming sexual activities. This is dependent upon your risk of infection, your rate of healing, and your comfort and desire to resume sexual activity.  Try to have someone help you with your household activities and your newborn for at least a few days after you leave the hospital.  Rest as much as possible. Try to rest or take a nap when your newborn is sleeping.  Increase your activities gradually.  Keep all of your scheduled postpartum appointments. It is very important to keep your scheduled follow-up appointments. At these appointments, your health care provider will be checking to make sure that you are healing physically and emotionally. SEEK MEDICAL CARE IF:   You are passing large clots from your vagina. Save any clots to show your health care provider.  You have a foul smelling discharge from your vagina.  You have trouble urinating.  You are urinating frequently.  You have pain when you urinate.  You have a change in your bowel movements.  You have increasing redness, pain, or swelling near your incision.  You have pus draining from your incision.  Your incision is separating.  You have painful, hard, or reddened breasts.  You have a severe headache.  You have blurred vision or see spots.  You feel sad or depressed.  You have thoughts of hurting yourself or your newborn.  You  have questions about your care, the care of your newborn, or medications.  You are dizzy or light-headed.  You have a rash.  You have pain, redness, or swelling at the site of the removed intravenous access (IV) tube.  You have nausea or vomiting.  You stopped breastfeeding and have not had a menstrual period within 12  weeks of stopping.  You are not breastfeeding and have not had a menstrual period within 12 weeks of delivery.  You have a fever. SEEK IMMEDIATE MEDICAL CARE IF:  You have persistent pain.  You have chest pain.  You have shortness of breath.  You faint.  You have leg pain.  You have stomach pain.  Your vaginal bleeding saturates 2 or more sanitary pads in 1 hour. MAKE SURE YOU:   Understand these instructions.  Will watch your condition.  Will get help right away if you are not doing well or get worse.   This information is not intended to replace advice given to you by your health care provider. Make sure you discuss any questions you have with your health care provider.   Document Released: 04/09/2002 Document Revised: 08/08/2014 Document Reviewed: 03/14/2012 Elsevier Interactive Patient Education 2016 [employer].   Iron-Rich Diet  Iron is a mineral that helps your body to produce hemoglobin. Hemoglobin is a protein in your red blood cells that carries oxygen to your body's tissues. Eating too little iron may cause you to feel weak and tired, and it can increase your risk for infection. Eating enough iron is necessary for your body's metabolism, muscle function, and nervous system. Iron is naturally found in many foods. It can also be added to foods or fortified in foods. There are two types of dietary iron:  Heme iron. Heme iron is absorbed by the body more easily than nonheme iron. Heme iron is found in meat, poultry, and fish.  Nonheme iron. Nonheme iron is found in dietary supplements, iron-fortified grains, beans, and vegetables. You may need to follow an iron-rich diet if:  You have been diagnosed with iron deficiency or iron-deficiency anemia.  You have a condition that prevents you from absorbing dietary iron, such as:  Infection in your intestines.  Celiac disease. This involves long-lasting (chronic) inflammation of your intestines.  You do not eat  enough iron.  You eat a diet that is high in foods that impair iron absorption.  You have lost a lot of blood.  You have heavy bleeding during your menstrual cycle.  You are pregnant. WHAT IS MY PLAN? Your health care provider may help you to determine how much iron you need per day based on your condition. Generally, when a person consumes sufficient amounts of iron in the diet, the following iron needs are met:  Men.  77-74 years old: 11 mg per day.  8-42 years old: 8 mg per day.  Women.   3-79 years old: 15 mg per day.  26-39 years old: 18 mg per day.  Over 24 years old: 8 mg per day.  Pregnant women: 27 mg per day.  Breastfeeding women: 9 mg per day. WHAT DO I NEED TO KNOW ABOUT AN IRON-RICH DIET?  Eat fresh fruits and vegetables that are high in vitamin C along with foods that are high in iron. This will help increase the amount of iron that your body absorbs from food, especially with foods containing nonheme iron. Foods that are high in vitamin C include oranges, peppers, tomatoes, and mango.  Take iron  supplements only as directed by your health care provider. Overdose of iron can be life-threatening. If you were prescribed iron supplements, take them with orange juice or a vitamin C supplement.  Cook foods in pots and pans that are made from iron.   Eat nonheme iron-containing foods alongside foods that are high in heme iron. This helps to improve your iron absorption.   Certain foods and drinks contain compounds that impair iron absorption. Avoid eating these foods in the same meal as iron-rich foods or with iron supplements. These include:  Coffee, black tea, and red wine.  Milk, dairy products, and foods that are high in calcium.  Beans, soybeans, and peas.  Whole grains.  When eating foods that contain both nonheme iron and compounds that impair iron absorption, follow these tips to absorb iron better.   Soak beans overnight before  cooking.  Soak whole grains overnight and drain them before using.  Ferment flours before baking, such as using yeast in bread dough. WHAT FOODS CAN I EAT? Grains Iron-fortified breakfast cereal. Iron-fortified whole-wheat bread. Enriched rice. Sprouted grains. Vegetables Spinach. Potatoes with skin. Green peas. Broccoli. Red and green bell peppers. Fermented vegetables. Fruits Prunes. Raisins. Oranges. Strawberries. Mango. Grapefruit. Meats and Other Protein Sources Beef liver. Oysters. Beef. Shrimp. [place]. Chicken. [place]. Sardines. Chickpeas. Nuts. Tofu. Beverages Tomato juice. Fresh orange juice. Prune juice. Hibiscus tea. Fortified instant breakfast shakes. Condiments Tahini. Fermented soy sauce. Sweets and Desserts Black-strap molasses.  Other Wheat germ. The items listed above may not be a complete list of recommended foods or beverages. Contact your dietitian for more options. WHAT FOODS ARE NOT RECOMMENDED? Grains Whole grains. Bran cereal. Bran flour. Oats. Vegetables Artichokes. Brussels sprouts. Kale. Fruits Blueberries. Raspberries. Strawberries. Figs. Meats and Other Protein Sources Soybeans. Products made from soy protein. Dairy Milk. Cream. Cheese. Yogurt. Cottage cheese. Beverages Coffee. Black tea. Red wine. Sweets and Desserts Cocoa. Chocolate. Ice cream. Other Basil. Oregano. Parsley. The items listed above may not be a complete list of foods and beverages to avoid. Contact your dietitian for more information.   This information is not intended to replace advice given to you by your health care provider. Make sure you discuss any questions you have with your health care provider.   Document Released: 03/01/2005 Document Revised: 08/08/2014 Document Reviewed: 02/12/2014 Elsevier Interactive Patient Education [employer].

## 2015-10-30 NOTE — Progress Notes (Signed)
CLINICAL SOCIAL WORK MATERNAL/CHILD NOTE  Patient Details  Name: Boy Xitlalic Maslin MRN: [id number] Date of Birth: 10/28/2014  Date:  10/30/2015  Clinical Social Worker Initiating Note:  Jamarea Selner E. Brigitte Pulse, [place] Date/ Time Initiated:  10/30/15/0930     Child's Name:  Rikki Spearing   Legal Guardian:   (Parents: Hershal Coria and Hazle Quant)   Need for Interpreter:  None   Date of Referral:  10/29/15     Reason for Referral:  Other (Comment) (Concern regarding relationship between MOB and FOB.  Maternal anxiety.)   Referral Source:  Physician   Address:  [address] [place], [place] [address]  Phone number:  [phone]   Household Members:  Minor Children (MOB has one other child, [place], age 42)   Natural Supports (not living in the home):  Immediate Family (MOB reports that FOB and her mother are her greatest support people.)   Professional Supports: None   Employment:     Type of Work:  (MOB works for [employer] and plans to be off for 8 weeks.  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 )   Education:      Financial Resources:  Medicaid   Other Resources:      Cultural/Religious Considerations Which May Impact Care: None stated.  Strengths:  Ability to meet basic needs , Pediatrician chosen , Home prepared for child , Compliance with medical plan  (Pediatric follow up will be with Dr. Lennie Hummer.)   Risk Factors/Current Problems:  Mental Health Concerns  (Anxiety)   Cognitive State:  Alert , Able to Concentrate , Paranoid , Poor Insight    Mood/Affect:  Calm , Relaxed , Apprehensive    CSW Assessment: CSW met with MOB in her first floor room/102 to offer support and complete assessment due to MD's concerns regarding relationship between FOB and MOB and MOB's high level of anxiety.  MOB was quietly lying in bed with baby sleeping beside her.  FOB was on the couch sleeping.  MOB welcomed CSW into the room and stated this was a good time  to talk with her. MOB presents as paranoid about CSW's presence, but was pleasant and willing to talk.  CSW explained support services offered by CSW and the desire to discuss how she is feeling emotionally, as well as provide education regarding perinatal mood disorders. MOB states she is doing well, but concerned about her baby who is significantly below birth weight, a poor eater, and very spitty, per MOB's report.  She reports that she has been discharged, but that baby will have to be re-evaluated tomorrow.  She reports feeling comfortable with this plan.  MOB reports her mother is currently caring for her daughter at home while she is in the hospital.  She reports that her mother is a good support person.  She reports FOB is also supportive and involved.  She states they do not live together, but are in a relationship.  He has one other child as well.  She states he lives approximately 15 minutes from her.  She states that his family business keeps him very busy, but that he has taken 8 days off to be with her and baby.  CSW mentioned report from staff that there seemed to be some stress between MOB and FOB overnight.  MOB became defensive and stated, "all couples argue."  She minimized the situation and states that she feels it was normal for any relationship.  She states they have "  worked through it" and reports there are no issues.  CSW agreed that many couples argue, but stated concern that an argument occurred a day following delivery to the point staff was aware of it.  CSW assured her that CSW is concerned with her wellbeing and is here to process any feelings she might have or discuss any possible concerns.  MOB asked if the argument is why CSW is talking with her and that she feels nervous any time a Education officer, museum wants to speak with her.  CSW reminded MOB that CSW attempted to meet with her yesterday, but that she was preparing to shower, before the incident happened.  CSW asked her what her  experience with social workers have been prior to this time and she said "none."  MOB assured CSW that there are no ongoing issues between her and FOB. CSW inquired about how she felt emotionally throughout her pregnancy and now.  MOB became fixated on the argument and had trouble moving past this topic.  CSW again assured her that CSW is here for her support in anything she might want to discuss with CSW.  MOB states she felt well throughout pregnancy and reports no emotional concerns at this time.  CSW asked if she has experienced anxiety, depression, or any other mental health concern prior to pregnancy and she reports she has anxiety.  CSW discussed coping mechanisms and MOB states that it helps her to get fresh air by going for a walk.  She then explained to CSW that she was trying to do this last night, but was told by nursing staff that she needed to stay in her room.  CSW understands from RN documentation that parents stated they planned to go to [facility] and come back and were told this was against hospital policy, but CSW did not discuss this with MOB.  CSW explained that she could not leave the hospital premises as a patient, but encouraged her to go outside to get some air, as she states she is experiencing "cabin fever."  CSW explained that someone will have to be in the room with baby, but now that she is no longer a patient, she can feel free to come and go.  MOB reports a way to cope with her anxiety in the past was by taking Prozac.  CSW inquired as to how she feels about restarting the medication at this time.  MOB reports that she does not feel she needs the medication at this time and that she is hesitant to take medication while she is breast feeding.  CSW explained that there are safe antidepressants she can take while breast feeding if she feels her anxiety is increasing.  CSW provided education regarding perinatal mood disorders and stressed the importance of talking with her doctor if she  has concerns about her mental health and or wants to restart her medication.  She agreed.  CSW also informed MOB of the Feelings After Birth support group held at [place] stated appreciation for the information.  She reports no questions, concerns or needs at this time.    CSW Plan/Description:  Patient/Family Education , No Further Intervention Required/No Barriers to Discharge    Alphonzo Cruise, [place] 10/30/2015, 4:01 PM

## 2015-10-30 NOTE — Progress Notes (Signed)
FOB walked out of the room with his bag, stating "she is giving me a panic attack and she needs help".  Patient opened the door and say to FOB, "how dare you talk about me to a stranger, I don't want to argue in front of our baby"  FOB then walks away and leaves the floor.

## 2015-10-30 NOTE — Progress Notes (Signed)
Postpartum Note Day # 2  S:  Patient resting comfortable in bed.  Pain controlled.  Tolerating general diet. + flatus, no BM.  Lochia moderate.  Ambulating without difficulty.  She denies n/v/f/c, SOB, or CP.  Pt plans on breastfeeding and bottle feeding.  O: Temp:  [98 F (36.7 C)-98.3 F (36.8 C)] 98 F (36.7 C) (03/31 0600) Pulse Rate:  [79-86] 79 (03/31 0600) Resp:  [18] 18 (03/31 0600) BP: (104-112)/(59-75) 112/75 mmHg (03/31 0600) SpO2:  [98 %] 98 % (03/30 0850)   Gen: A&Ox3, NAD CV: RRR, no MRG Resp: CTAB Abdomen: soft, NT, ND +BS Uterus: firm, non-tender, below umbilicus Incision: bandage with dried blood, dry and intact Ext: No edema, no calf tenderness bilaterally  Labs:  CBC Latest Ref Rng 10/29/2015 10/27/2015 08/04/2015  WBC 4.0 - 10.5 K/uL 12.0(H) 9.1 9.3  Hemoglobin 12.0 - 15.0 g/dL 10.2(L) 12.9 11.6(L)  Hematocrit 36.0 - 46.0 % 29.9(L) 37.5 34.0(L)  Platelets 150 - 400 K/uL 193 217 243    A/P: Pt is a 40 y.o. [id number] s/p repeat C-section, POD#2  - Pain well controlled -GU: Voiding freely -GI: Tolerating general diet -Activity: encouraged sitting up to chair and ambulation as tolerated -Prophylaxis: early ambulation -Labs: stable as above -Plan for outpatient baby boy circ -due to h/o Depression and social concerns- plan for social services consult  DISPO: Continue with routine postoperative care, possible discharge home today pending baby's discharge.  Myna HidalgoJennifer Nhu Glasby, DO [phone] (pager) [phone] (office)

## 2015-10-30 NOTE — Lactation Note (Signed)
This note was copied from a baby's chart. Lactation Consultation Note  Entered mom's room and she told me her breasts hurt though she had recently pumped about 70 ml .  She asked me to look at them because "I knew better".  Her breasts were filling and many nodules could be felt.  Baby was laying next to her and rooting. Mother was asked if she planned to latch him and she stated she would like to try.  Assisted her with positioning and Hunter latched; mother reported pain of an 8 on the pain scale so he was repositioned and she was more comfortable. He fell asleep relatively quickly but he also had just eaten about 35 ml of colostrum. Suggested mother pump again and she was agreeable to this. The combination of Hunter latching and mom pumping softened her breasts somewhat. Recommended she latch baby or express milk if they began to get uncomfortable again.  Patient Name: Boy Ledell PeoplesJennifer Woehrle ZOXWR'UToday's Date: 10/30/2015     Maternal Data    Feeding    LATCH Score/Interventions                      Lactation Tools Discussed/Used     Consult Status      Soyla DryerJoseph, Reisa Coppola 10/30/2015, 5:05 PM

## 2015-10-31 ENCOUNTER — Ambulatory Visit: Payer: Self-pay

## 2015-10-31 LAB — TYPE AND SCREEN
ABO/RH(D): A POS
ANTIBODY SCREEN: NEGATIVE
UNIT DIVISION: 0
Unit division: 0

## 2015-10-31 NOTE — Lactation Note (Signed)
This note was copied from a baby's chart. Lactation Consultation Note  Patient Name: Boy Ledell PeoplesJennifer Huaracha WUJWJ'XToday's Date: 10/31/2015 Reason for consult: Follow-up assessment;Infant weight loss Mom's milk is coming in, breasts slightly red, nodules present, Mom c/o of discomfort.  LC asked Mom what her plan was for feeding her baby but she could not articulate a definite plan other than she is worried about baby's weight loss and plans to give formula.  with each feeding. Mom reports Dr. Rana SnareLowe wants baby to have formula with each feeding due to weight loss. Mom reported that Dr. Rana SnareLowe did not want her to put baby to breast but to pump and give EBM/formula so amounts could be measured.  LC advised Mom that Lactation is here to help here with her feeding plan regardless of whether she is breast or bottle feeding. Mom reports she would like to give some breastmilk but not sure if she can pump every 3 hours as recommended. Mom does not have DEBP for home use but has WIC. LC discussed WIC loaner program if Mom interested. Discussed with Mom the importance of pumping every 3 hours to prevent engorgement and to protect milk supply if she wants to provide breastmilk to the baby otherwise if she wants to formula bottle feed, then she would want to limit pumping and use cabbage leaves to dry her milk. Mom could not give LC definite answer on feeding plan. Mom reports she and FOB will decide and advise. Encouraged Mom to pump at this time and place ice packs on her breasts while she is deciding what she would like to do. Discussed giving baby 45-60 ml of breast milk/formula each feeding every 3 hours. Call for assist.   Maternal Data    Feeding Feeding Type: Bottle Fed - Formula Nipple Type: Slow - flow  LATCH Score/Interventions                      Lactation Tools Discussed/Used Tools: Pump Breast pump type: Double-Electric Breast Pump   Consult Status Consult Status: Follow-up Date:  11/01/15 Follow-up type: In-patient    Alfred LevinsGranger, Donya Tomaro Ann 10/31/2015, 12:39 PM

## 2015-11-03 NOTE — Discharge Summary (Signed)
OB Discharge Summary     Patient Name: Sara Montoya DOB: 01/07/1976 MRN: [id number]  Date of admission: 10/28/2015 Delivering MD: Myna Hidalgo   Date of discharge: 10/30/2015  Admitting diagnosis: Z98.891 History of Cesarean Section, Fetal malpresentation, GDMA1 Intrauterine pregnancy: [redacted]w[redacted]d     Secondary diagnosis:  Active Problems:   Intrauterine normal pregnancy  Additional problems: Fetal malpresentation, GDMA1, history of Depression      Discharge diagnosis: Term Pregnancy Delivered and GDM A1                                                                                                Post partum procedures:N/A  Augmentation: N/A  Complications: None  Hospital course:  Sceduled C/S   40 y.o. yo G2P2002 at [redacted]w[redacted]d was admitted to the hospital 10/28/2015 for scheduled cesarean section with the following indication:Malpresentation.  Membrane Rupture Time/Date: 7:47 AM ,10/28/2015   Patient delivered a Viable infant.10/28/2015  Details of operation can be found in separate operative note.  Pateint had an uncomplicated postpartum course.  She is ambulating, tolerating a regular diet, passing flatus, and urinating well. Patient is discharged home in stable condition on  11/03/2015          Physical exam  Filed Vitals:   10/29/15 0445 10/29/15 0850 10/29/15 1724 10/30/15 0600  BP: 88/61 104/59 112/71 112/75  Pulse: 56 86 79 79  Temp: 98.4 F (36.9 C) 98.1 F (36.7 C) 98.3 F (36.8 C) 98 F (36.7 C)  TempSrc:  Oral Oral Oral  Resp: Height:      Weight:      SpO2: 96% 98%     General: alert, cooperative and no distress Lochia: appropriate Uterine Fundus: firm Incision: Dressing is clean, dry, and intact DVT Evaluation: No evidence of DVT seen on physical exam. Labs: Lab Results  Component Value Date   WBC 12.0* 10/29/2015   HGB 10.2* 10/29/2015   HCT 29.9* 10/29/2015   MCV 89.8 10/29/2015   PLT 193 10/29/2015   CMP Latest Ref Rng 09/17/2014   Glucose 70 - 99 mg/dL 83  BUN 6 - 23 mg/dL 7  Creatinine 4.09 - 8.11 mg/dL 9.14  Sodium 782 - 956 mEq/L 137  Potassium 3.5 - 5.3 mEq/L 4.2  Chloride 96 - 112 mEq/L 104  CO2 19 - 32 mEq/L 24  Calcium 8.4 - 10.5 mg/dL 9.0  Total Protein 6.0 - 8.3 g/dL 6.8  Total Bilirubin 0.2 - 1.2 mg/dL 0.8  Alkaline Phos 39 - 117 U/L 65  AST 0 - 37 U/L 16  ALT 0 - 35 U/L 10    Discharge instruction: per After Visit Summary and "Baby and Me Booklet".  After visit meds:    Medication List    TAKE these medications        docusate sodium 100 MG capsule  Commonly known as:  COLACE  Take 1 capsule (100 mg total) by mouth 2 (two) times daily.     ibuprofen 600 MG tablet  Commonly known as:  ADVIL,MOTRIN  Take 1 tablet (600 mg total) by  mouth every 6 (six) hours.     oxyCODONE-acetaminophen 5-325 MG tablet  Commonly known as:  ROXICET  Take 1 tablet by mouth every 6 (six) hours as needed for severe pain.     prenatal multivitamin Tabs tablet  Take 1 tablet by mouth daily at 12 noon.        Diet: carb modified diet  Activity: Advance as tolerated. Pelvic rest for 6 weeks.   Outpatient follow up:2 weeks Follow up Appt:No future appointments. Follow up Visit:No Follow-up on file.  Postpartum contraception: Progesterone only pills  Newborn Data: Live born female  Birth Weight: 6 lb 5.2 oz (2870 g) APGAR: 8, 9  Baby Feeding: Bottle/bottle Disposition:home with mother   11/03/2015 [name], Kahlan, M, DO

## 2015-11-21 ENCOUNTER — Telehealth: Payer: Self-pay | Admitting: Certified Nurse Midwife

## 2015-11-21 NOTE — Telephone Encounter (Signed)
Sara PeoplesJennifer Avis ([birth year]) s/p LTCS on 3/29, Pt of Dr[facility] Charlotta Newtonzan, [facility] with complaints of a "stiche open" in her LTCS incision. Per conversation during return phone call, pt report recently seeing Dr. Charlotta Newtonzan for tenderness and redness at incision site. She was placed on antibiotics, Clindamycin at that time.   Pt now states that a "stitch" is open draining dime size amount of red tinged fluid.   Pt advised to place a bandaid/bandage on the open stitch ( may use a panty liner if a bandage is not readily available) and monitor drainage.  If foul smelling, advised to call her provider. Recommended to keep the incision clean, dry and to avoid scented soaps.  Call PRN if fever, foul odor, foul drainage,  or incisional wound becomes large. R.Foster Sonnier, CNM

## 2015-12-15 ENCOUNTER — Other Ambulatory Visit (HOSPITAL_COMMUNITY)
Admission: RE | Admit: 2015-12-15 | Discharge: 2015-12-15 | Disposition: A | Discharge status: Home / Self Care | Payer: Medicaid Other | Source: Ambulatory Visit | Attending: Obstetrics & Gynecology | Admitting: Obstetrics & Gynecology

## 2015-12-15 ENCOUNTER — Other Ambulatory Visit: Payer: Self-pay | Admitting: Obstetrics & Gynecology

## 2015-12-15 DIAGNOSIS — Z01411 Encounter for gynecological examination (general) (routine) with abnormal findings: Secondary | ICD-10-CM | POA: Insufficient documentation

## 2015-12-15 DIAGNOSIS — Z1151 Encounter for screening for human papillomavirus (HPV): Secondary | ICD-10-CM | POA: Diagnosis not present

## 2015-12-17 LAB — CYTOLOGY - PAP

## 2016-11-15 ENCOUNTER — Ambulatory Visit: Payer: Self-pay | Admitting: Family Medicine

## 2016-11-15 ENCOUNTER — Encounter: Payer: Self-pay | Admitting: Family Medicine

## 2016-12-31 ENCOUNTER — Encounter: Payer: Self-pay | Admitting: Family Medicine

## 2017-01-06 ENCOUNTER — Encounter: Payer: Self-pay | Admitting: Family Medicine

## 2017-01-06 ENCOUNTER — Ambulatory Visit (INDEPENDENT_AMBULATORY_CARE_PROVIDER_SITE_OTHER): Payer: Self-pay | Admitting: Family Medicine

## 2017-01-06 VITALS — BP 100/69 | HR 81 | Temp 98.9°F | Resp 16 | Ht 59.5 in | Wt 103.0 lb

## 2017-01-06 DIAGNOSIS — N898 Other specified noninflammatory disorders of vagina: Secondary | ICD-10-CM

## 2017-01-06 DIAGNOSIS — N760 Acute vaginitis: Secondary | ICD-10-CM

## 2017-01-06 DIAGNOSIS — Z0000 Encounter for general adult medical examination without abnormal findings: Secondary | ICD-10-CM

## 2017-01-06 DIAGNOSIS — R5383 Other fatigue: Secondary | ICD-10-CM

## 2017-01-06 DIAGNOSIS — F419 Anxiety disorder, unspecified: Secondary | ICD-10-CM

## 2017-01-06 DIAGNOSIS — Z01419 Encounter for gynecological examination (general) (routine) without abnormal findings: Secondary | ICD-10-CM

## 2017-01-06 DIAGNOSIS — E119 Type 2 diabetes mellitus without complications: Secondary | ICD-10-CM

## 2017-01-06 DIAGNOSIS — F329 Major depressive disorder, single episode, unspecified: Secondary | ICD-10-CM

## 2017-01-06 DIAGNOSIS — B9689 Other specified bacterial agents as the cause of diseases classified elsewhere: Secondary | ICD-10-CM

## 2017-01-06 DIAGNOSIS — Z13220 Encounter for screening for lipoid disorders: Secondary | ICD-10-CM

## 2017-01-06 LAB — POCT GLYCOSYLATED HEMOGLOBIN (HGB A1C): Hemoglobin A1C: 5.1

## 2017-01-06 LAB — POCT WET + KOH PREP
TRICH BY WET PREP: ABSENT
Yeast by KOH: ABSENT
Yeast by wet prep: ABSENT

## 2017-01-06 LAB — POCT URINALYSIS DIP (MANUAL ENTRY)
BILIRUBIN UA: NEGATIVE
BILIRUBIN UA: NEGATIVE mg/dL
Glucose, UA: NEGATIVE mg/dL
LEUKOCYTES UA: NEGATIVE
Nitrite, UA: NEGATIVE
PH UA: 6 (ref 5.0–8.0)
PROTEIN UA: NEGATIVE mg/dL
RBC UA: NEGATIVE
SPEC GRAV UA: 1.025 (ref 1.010–1.025)
Urobilinogen, UA: 0.2 E.U./dL

## 2017-01-06 LAB — GLUCOSE, POCT (MANUAL RESULT ENTRY): POC Glucose: 85 mg/dl (ref 70–99)

## 2017-01-06 LAB — POCT URINE PREGNANCY: PREG TEST UR: NEGATIVE

## 2017-01-06 MED ORDER — DULOXETINE HCL 30 MG PO CPEP
30.0000 mg | ORAL_CAPSULE | Freq: Every day | ORAL | 3 refills | Status: AC
Start: 2017-01-06 — End: ?

## 2017-01-06 MED ORDER — FLUOXETINE HCL 20 MG PO TABS: 20.0000 mg | ORAL_TABLET | Freq: Every day | ORAL | 3 refills | Status: AC

## 2017-01-06 NOTE — Progress Notes (Signed)
Subjective:    Patient ID: Sara Montoya, female    DOB: 02-12-1976, 41 y.o.   MRN: [id number]  01/06/2017  Annual Exam (w/pap, depression, score 8)   HPI This 41 y.o. female presents for Complete Physical Examination.  Last physical:  [facility]. Pap smear: 10/2015 abnormal.  Mammogram:  never Eye exam:  never Dental exam:     Visual Acuity Screening   Right eye Left eye Both eyes  Without correction: 20/20 20/20 20/20   With correction:        Immunization History  Administered Date(s) Administered  . Influenza Split 05/12/2011  . Influenza Whole 05/14/2007, 05/07/2008, 04/29/2009, 04/27/2010  . Influenza-Unspecified 05/27/2015  . PPD Test 04/12/2011, 03/28/2012, 04/22/2014  . Pneumococcal Polysaccharide-23 10/30/2015  . Tdap 09/10/2015   BP Readings from Last 3 Encounters:  01/06/17 100/69  10/30/15 112/75  09/28/15 110/63   Wt Readings from Last 3 Encounters:  01/06/17 103 lb (46.7 kg)  10/27/15 136 lb (61.7 kg)  10/22/15 136 lb (61.7 kg)    Gestational DM: having labile sugars; ranges 50-180; rare 180.  Anxiety and depression: anxiety has been worsening since birth of son.  Also having panic attacks.  Denies SI/HI.  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  Did not feel well with Prozac.   Review of Systems  Constitutional: Negative for activity change, appetite change, chills, diaphoresis, fatigue, fever and unexpected weight change.  HENT: Negative for congestion, dental problem, drooling, ear discharge, ear pain, facial swelling, hearing loss, mouth sores, nosebleeds, postnasal drip, rhinorrhea, sinus pressure, sneezing, sore throat, tinnitus, trouble swallowing and voice change.   Eyes: Negative for photophobia, pain, discharge, redness, itching and visual disturbance.  Respiratory: Negative for apnea, cough, choking, chest tightness, shortness of breath, wheezing and stridor.   Cardiovascular: Negative  for chest pain, palpitations and leg swelling.  Gastrointestinal: Negative for abdominal distention, abdominal pain, anal bleeding, blood in stool, constipation, diarrhea, nausea, rectal pain and vomiting.  Endocrine: Negative for cold intolerance, heat intolerance, polydipsia, polyphagia and polyuria.  Genitourinary: Negative for decreased urine volume, difficulty urinating, dyspareunia, dysuria, enuresis, flank pain, frequency, genital sores, hematuria, menstrual problem, pelvic pain, urgency, vaginal bleeding, vaginal discharge and vaginal pain.  Musculoskeletal: Negative for arthralgias, back pain, gait problem, joint swelling, myalgias, neck pain and neck stiffness.  Skin: Negative for color change, pallor, rash and wound.  Allergic/Immunologic: Negative for environmental allergies, food allergies and immunocompromised state.  Neurological: Negative for dizziness, tremors, seizures, syncope, facial asymmetry, speech difficulty, weakness, light-headedness, numbness and headaches.  Hematological: Negative for adenopathy. Does not bruise/bleed easily.  Psychiatric/Behavioral: Positive for dysphoric mood. Negative for agitation, behavioral problems, confusion, decreased concentration, hallucinations, self-injury, sleep disturbance and suicidal ideas. The patient is nervous/anxious. The patient is not hyperactive.     Past Medical History:  Diagnosis Date  . Cervical dysplasia    ASCUS, s/p colposcopy 2005, C1N1  . Depression    onset age teenager; menstrually related.  . Eczema    Lupton/dermatology.  . Gestational diabetes   . Gestational diabetes mellitus (GDM), antepartum   . History of pre-eclampsia in prior pregnancy, currently pregnant   . HPV (human papilloma virus) anogenital infection   . UTI (lower urinary tract infection)   . Vaginal discharge    chronic  . Vaginal Pap smear, abnormal   . Varicose vein    Past Surgical History:  Procedure Laterality Date  . CESAREAN  SECTION  2000  .  CESAREAN SECTION N/A 10/28/2015   Procedure: CESAREAN SECTION;  Surgeon: Myna Hidalgo, DO;  Location: [facility];  Service: Obstetrics;  Laterality: N/A;  EDD 11/04/15  . COLPOSCOPY    . WISDOM TOOTH EXTRACTION  1999   Allergies  Allergen Reactions  . Codeine Hives    Pt has tolerated percocet in the past  . Doxycycline Hyclate Hives  . Latex Hives  . Lexapro [Escitalopram Oxalate]     Extreme fatigue  . Penicillins Hives    Has patient had a PCN reaction causing immediate rash, facial/tongue/throat swelling, SOB or lightheadedness with hypotension: No Has patient had a PCN reaction causing severe rash involving mucus membranes or skin necrosis: No Has patient had a PCN reaction that required hospitalization No Has patient had a PCN reaction occurring within the last 10 years: No If all of the above answers are "NO", then may proceed with Cephalosporin use.   . Prozac [Fluoxetine Hcl]     Extreme fatigue   . Sulfamethoxazole-Trimethoprim Other (See Comments)    REACTION: bumps under skin    Social History   Social History  . Marital status: Single    Spouse name: N/A  . Number of children: 2  . Years of education: N/A   Occupational History  . CNA    Social History Main Topics  . Smoking status: Current Every Day Smoker    Packs/day: 3.30    Years: 5.00    Types: Cigarettes  . Smokeless tobacco: Never Used  . Alcohol use Yes  . Drug use: No  . Sexual activity: Yes   Other Topics Concern  . Not on file   Social History Narrative   Financial assistance approved for 100% discount at [facility] and has [facility] card per Rudell Cobb   07/09/2010      Marital status: single; dating seriously x 2 years with father of Hunter      Children:  1 child (19), 59 month old son/Hunter      Lives: with 2 children, boyfriend.      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      Tobacco: 1/2 ppd      Alcohol: 4 times per week; 3 glasses wine      Drugs: none      Exercise: walking  baby three days per week.      Seatbelt: 100%      Family History  Problem Relation Age of Onset  . Depression Mother   . Hypertension Mother   . Hyperlipidemia Mother   . Mental illness Mother   . Cancer Father        prostate cancer  . Hyperlipidemia Father   . Fibroids Sister   . Thyroid disease Paternal Grandmother   . Diabetes Maternal Grandfather   . Heart disease Neg Hx        Objective:    BP 100/69   Pulse 81   Temp 98.9 F (37.2 C) (Oral)   Resp 16   Ht 4' 11.5" (1.511 m)   Wt 103 lb (46.7 kg)   LMP 12/30/2016   SpO2 95%   BMI 20.46 kg/m  Physical Exam  Constitutional: She is oriented to person, place, and time. She appears well-developed and well-nourished. No distress.  HENT:  Head: Normocephalic and atraumatic.  Right Ear: External ear normal.  Left Ear: External ear normal.  Nose: Nose normal.  Mouth/Throat: Oropharynx is clear and moist.  Eyes: Conjunctivae and EOM are normal. Pupils are equal, round, and  reactive to light.  Neck: Normal range of motion and full passive range of motion without pain. Neck supple. No JVD present. Carotid bruit is not present. No thyromegaly present.  Cardiovascular: Normal rate, regular rhythm and normal heart sounds.  Exam reveals no gallop and no friction rub.   No murmur heard. Pulmonary/Chest: Effort normal and breath sounds normal. She has no wheezes. She has no rales. Right breast exhibits no inverted nipple, no mass, no nipple discharge, no skin change and no tenderness. Left breast exhibits no inverted nipple, no mass, no nipple discharge, no skin change and no tenderness. Breasts are symmetrical.  Abdominal: Soft. Bowel sounds are normal. She exhibits no distension and no mass. There is no tenderness. There is no rebound and no guarding.  Genitourinary: Uterus normal. There is no rash, tenderness, lesion or injury on the right labia. There is no rash, tenderness, lesion or injury on the left labia. Cervix exhibits  friability. Cervix exhibits no motion tenderness and no discharge. Right adnexum displays no mass, no tenderness and no fullness. Left adnexum displays no mass, no tenderness and no fullness. No erythema or tenderness in the vagina. No foreign body in the vagina. Vaginal discharge found.  Musculoskeletal:       Right shoulder: Normal.       Left shoulder: Normal.       Cervical back: Normal.  Lymphadenopathy:    She has no cervical adenopathy.  Neurological: She is alert and oriented to person, place, and time. She has normal reflexes. No cranial nerve deficit. She exhibits normal muscle tone. Coordination normal.  Skin: Skin is warm and dry. No rash noted. She is not diaphoretic. No erythema. No pallor.  Psychiatric: She has a normal mood and affect. Her behavior is normal. Judgment and thought content normal.  Nursing note and vitals reviewed.   Depression screen [facility] 2/9 01/06/2017 07/02/2015 09/17/2014  Decreased Interest 1 0 0  Down, Depressed, Hopeless 1 1 0  PHQ - 2 Score 2 1 0  Altered sleeping 1 - -  Tired, decreased energy 2 - -  Change in appetite 0 - -  Feeling bad or failure about yourself  1 - -  Trouble concentrating 1 - -  Moving slowly or fidgety/restless 1 - -  Suicidal thoughts 0 - -  PHQ-9 Score 8 - -  Difficult doing work/chores Somewhat difficult - -       Assessment & Plan:   1. Routine physical examination   2. Encounter for gynecological examination without abnormal finding   3. Type 2 diabetes mellitus without complication, without long-term current use of insulin (HCC)   4. Anxiety and depression   5. Screening, lipid   6. Other fatigue   7. Vaginal discharge    -anticipatory guidance provided --- exercise, weight loss, safe driving practices, safe sexual practices. -obtain age appropriate screening labs and labs for chronic disease management. -obtain pap smear. -pt not interested in contraception at this time. -uncontrolled depression with anxiety;  suffers with fatigue with Prozac; rx for Cymbalta provided yet will also provide with rx for Prozac if patient is intolerant to Cymbalta.  -suffering with fatigue; obtain labs to rule out secondary causes of fatigue; likely due to uncontrolled depression/anxiety -new onset vaginal discharge; obtain wet prep.   Orders Placed This Encounter  Procedures  . CBC with Differential/Platelet  . Comprehensive metabolic panel    Order Specific Question:   Has the patient fasted?    Answer:   Yes  .  Lipid panel    Order Specific Question:   Has the patient fasted?    Answer:   Yes  . TSH  . Vitamin B12  . VITAMIN D 25 Hydroxy (Vit-D Deficiency, Fractures)  . Microalbumin, urine  . POCT urinalysis dipstick  . POCT glucose (manual entry)  . POCT glycosylated hemoglobin (Hb A1C)  . POCT urine pregnancy  . POCT Wet + KOH Prep  . HM Diabetes Foot Exam   Meds ordered this encounter  Medications  . DULoxetine (CYMBALTA) 30 MG capsule    Sig: Take 1 capsule (30 mg total) by mouth daily.    Dispense:  30 capsule    Refill:  3  . FLUoxetine (PROZAC) 20 MG tablet    Sig: Take 1-2 tablets (20-40 mg total) by mouth daily.    Dispense:  60 tablet    Refill:  3    Return in about 4 months (around 05/08/2017) for recheck anxiety/depression.   Angeligue Bowne Paulita FujitaMartin Albie Bazin, M.D. [facility]  [place] [facility] [address] [place], [place]  [id number] [phone] phone [phone] fax

## 2017-01-06 NOTE — Patient Instructions (Addendum)
Please email me in 4-6 weeks with an update regarding anxiety/depression.    IF you received an x-ray today, you will receive an invoice from [facility]. Please contact [facility] at [phone] with questions or concerns regarding your invoice.   IF you received labwork today, you will receive an invoice from [facility]. Please contact [facility] at [phone] with questions or concerns regarding your invoice.   Our billing staff will not be able to assist you with questions regarding bills from these companies.  You will be contacted with the lab results as soon as they are available. The fastest way to get your results is to activate your My Chart account. Instructions are located on the last page of this paperwork. If you have not heard from [place] regarding the results in 2 weeks, please contact this office.     Preventive Care 18-39 Years, Female Preventive care refers to lifestyle choices and visits with your health care provider that can promote health and wellness. What does preventive care include?  A yearly physical exam. This is also called an annual well check.  Dental exams once or twice a year.  Routine eye exams. Ask your health care provider how often you should have your eyes checked.  Personal lifestyle choices, including: ? Daily care of your teeth and gums. ? Regular physical activity. ? Eating a healthy diet. ? Avoiding tobacco and drug use. ? Limiting alcohol use. ? Practicing safe sex. ? Taking vitamin and mineral supplements as recommended by your health care provider. What happens during an annual well check? The services and screenings done by your health care provider during your annual well check will depend on your age, overall health, lifestyle risk factors, and family history of disease. Counseling Your health care provider may ask you questions about your:  Alcohol use.  Tobacco use.  Drug use.  Emotional well-being.  Home  and relationship well-being.  Sexual activity.  Eating habits.  Work and work Statistician.  Method of birth control.  Menstrual cycle.  Pregnancy history.  Screening You may have the following tests or measurements:  Height, weight, and BMI.  Diabetes screening. This is done by checking your blood sugar (glucose) after you have not eaten for a while (fasting).  Blood pressure.  Lipid and cholesterol levels. These may be checked every 5 years starting at age 73.  Skin check.  Hepatitis C blood test.  Hepatitis B blood test.  Sexually transmitted disease (STD) testing.  BRCA-related cancer screening. This may be done if you have a family history of breast, ovarian, tubal, or peritoneal cancers.  Pelvic exam and Pap test. This may be done every 3 years starting at age 62. Starting at age 36, this may be done every 5 years if you have a Pap test in combination with an HPV test.  Discuss your test results, treatment options, and if necessary, the need for more tests with your health care provider. Vaccines Your health care provider may recommend certain vaccines, such as:  Influenza vaccine. This is recommended every year.  Tetanus, diphtheria, and acellular pertussis (Tdap, Td) vaccine. You may need a Td booster every 10 years.  Varicella vaccine. You may need this if you have not been vaccinated.  HPV vaccine. If you are 56 or younger, you may need three doses over 6 months.  Measles, mumps, and rubella (MMR) vaccine. You may need at least one dose of MMR. You may also need a second dose.  Pneumococcal 13-valent conjugate (PCV13)  vaccine. You may need this if you have certain conditions and were not previously vaccinated.  Pneumococcal polysaccharide (PPSV23) vaccine. You may need one or two doses if you smoke cigarettes or if you have certain conditions.  Meningococcal vaccine. One dose is recommended if you are age 16-21 years and a first-year college student  living in a residence hall, or if you have one of several medical conditions. You may also need additional booster doses.  Hepatitis A vaccine. You may need this if you have certain conditions or if you travel or work in places where you may be exposed to hepatitis A.  Hepatitis B vaccine. You may need this if you have certain conditions or if you travel or work in places where you may be exposed to hepatitis B.  Haemophilus influenzae type b (Hib) vaccine. You may need this if you have certain risk factors.  Talk to your health care provider about which screenings and vaccines you need and how often you need them. This information is not intended to replace advice given to you by your health care provider. Make sure you discuss any questions you have with your health care provider. Document Released: 09/13/2001 Document Revised: 04/06/2016 Document Reviewed: 05/19/2015 Elsevier Interactive Patient Education  2017 [employer].

## 2017-01-07 LAB — MICROALBUMIN, URINE: Microalbumin, Urine: 15.5 ug/mL

## 2017-01-09 ENCOUNTER — Telehealth: Payer: Self-pay | Admitting: Family Medicine

## 2017-01-09 NOTE — Telephone Encounter (Signed)
PT CALLING ABOUT LABS 

## 2017-01-10 LAB — PAP IG, CT-NG NAA, HPV HIGH-RISK
Chlamydia, Nuc. Acid Amp: NEGATIVE
Gonococcus by Nucleic Acid Amp: NEGATIVE
HPV, high-risk: NEGATIVE
PAP Smear Comment: 0

## 2017-01-11 ENCOUNTER — Telehealth: Payer: Self-pay | Admitting: Family Medicine

## 2017-01-11 NOTE — Telephone Encounter (Signed)
Pt is calling to get her lab results from 01/06/17.  She is also asking about coupons or discount vouchers for Cymbalta and Prozac.  Please advise [phone]

## 2017-01-12 LAB — CBC WITH DIFFERENTIAL/PLATELET
BASOS: 1 %
Basophils Absolute: 0.1 10*3/uL (ref 0.0–0.2)
EOS (ABSOLUTE): 0.2 10*3/uL (ref 0.0–0.4)
EOS: 2 %
HEMATOCRIT: 41.9 % (ref 34.0–46.6)
Hemoglobin: 13.9 g/dL (ref 11.1–15.9)
Immature Grans (Abs): 0 10*3/uL (ref 0.0–0.1)
Immature Granulocytes: 0 %
LYMPHS ABS: 1.8 10*3/uL (ref 0.7–3.1)
Lymphs: 28 %
MCH: 30.5 pg (ref 26.6–33.0)
MCHC: 33.2 g/dL (ref 31.5–35.7)
MCV: 92 fL (ref 79–97)
MONOS ABS: 0.4 10*3/uL (ref 0.1–0.9)
Monocytes: 7 %
Neutrophils Absolute: 4.1 10*3/uL (ref 1.4–7.0)
Neutrophils: 62 %
Platelets: 312 10*3/uL (ref 150–379)
RBC: 4.56 x10E6/uL (ref 3.77–5.28)
RDW: 13.2 % (ref 12.3–15.4)
WBC: 6.6 10*3/uL (ref 3.4–10.8)

## 2017-01-12 LAB — COMPREHENSIVE METABOLIC PANEL

## 2017-01-12 LAB — TSH: TSH: 1.02 u[IU]/mL (ref 0.450–4.500)

## 2017-01-12 LAB — LIPID PANEL

## 2017-01-12 LAB — VITAMIN B12

## 2017-01-12 LAB — VITAMIN D 25 HYDROXY (VIT D DEFICIENCY, FRACTURES)

## 2017-01-13 NOTE — Telephone Encounter (Signed)
Results released to the patient.  I am not sure why these results did not automatically release 4 business days after they resulted to Dr. Michaelle CopasSmith's in basket. It is possible that there is an incomplete item.  Dr. Katrinka BlazingSmith will advise her of any need for additional evaluation or treatment based on the results.

## 2017-01-13 NOTE — Telephone Encounter (Signed)
Please release results. Thanks.

## 2017-01-13 NOTE — Telephone Encounter (Signed)
I spoke with the patient and she wanted her [facility] for [facility]ymbalta and [facility]ac [facility] [address] [facility] [facility] [address] So [facility] and  Sharyl NimrodMeredith stated [facility] and have tell them to [facility] and have them [facility] her Rx's to them. So I advised her of this and she stated she will [facility]. I also let her know that all her lab results weren't back and once they are and the Dr.  Signs off on them I we will call her with her results, she verbalized understanding.

## 2017-01-14 LAB — COMPREHENSIVE METABOLIC PANEL
ALK PHOS: 73 IU/L (ref 39–117)
ALT: 11 IU/L (ref 0–32)
AST: 19 IU/L (ref 0–40)
Albumin/Globulin Ratio: 1.6 (ref 1.2–2.2)
Albumin: 4.2 g/dL (ref 3.5–5.5)
BUN / CREAT RATIO: 8 — AB (ref 9–23)
BUN: 6 mg/dL (ref 6–20)
Bilirubin Total: <0.2 mg/dL (ref 0.0–1.2)
CO2: 18 mmol/L — AB (ref 20–29)
CREATININE: 0.71 mg/dL (ref 0.57–1.00)
Calcium: 9 mg/dL (ref 8.7–10.2)
Chloride: 103 mmol/L (ref 96–106)
GFR calc Af Amer: 124 mL/min/{1.73_m2} (ref >59)
GFR calc non Af Amer: 108 mL/min/{1.73_m2} (ref >59)
GLOBULIN, TOTAL: 2.7 g/dL (ref 1.5–4.5)
GLUCOSE: 80 mg/dL (ref 65–99)
Potassium: 4.5 mmol/L (ref 3.5–5.2)
SODIUM: 137 mmol/L (ref 134–144)
Total Protein: 6.9 g/dL (ref 6.0–8.5)

## 2017-01-14 LAB — SPECIMEN STATUS REPORT

## 2017-01-14 LAB — LIPID PANEL
CHOLESTEROL TOTAL: 167 mg/dL (ref 100–199)
Chol/HDL Ratio: 2.7 ratio (ref 0.0–4.4)
HDL: 61 mg/dL (ref >39)
LDL CALC: 90 mg/dL (ref 0–99)
TRIGLYCERIDES: 81 mg/dL (ref 0–149)
VLDL Cholesterol Cal: 16 mg/dL (ref 5–40)

## 2017-01-14 LAB — VITAMIN D 25 HYDROXY (VIT D DEFICIENCY, FRACTURES): VIT D 25 HYDROXY: 14.1 ng/mL — AB (ref 30.0–100.0)

## 2017-01-14 LAB — VITAMIN B12: VITAMIN B 12: 623 pg/mL (ref 232–1245)

## 2017-01-15 MED ORDER — METRONIDAZOLE 500 MG PO TABS: 500.0000 mg | ORAL_TABLET | Freq: Two times a day (BID) | ORAL | 0 refills | Status: AC

## 2017-01-15 NOTE — Telephone Encounter (Signed)
Labs were cancelled and reordered which I feel delayed results being released via MyChart; I have also sent message to patient with results.  May labs did not result until 01/12/17.

## 2017-01-15 NOTE — Addendum Note (Signed)
Addended by: Ethelda ChickSMITH, Che Rachal M on: 01/15/2017 04:31 PM   Modules accepted: Orders

## 2017-02-07 ENCOUNTER — Telehealth: Payer: Self-pay | Admitting: *Deleted

## 2017-02-07 ENCOUNTER — Telehealth: Payer: Self-pay | Admitting: Family Medicine

## 2017-02-07 NOTE — Telephone Encounter (Signed)
Rx for Metron[facility] (Flagyl) [facility] [address] [facility] [phone].

## 2017-02-07 NOTE — Telephone Encounter (Signed)
Pt is calling about lab results she states that is has been over a month now and still no response from us .  Pt also needs to know if she needs to if she needs an office visit or a nurse visit to a tb skin test I explained the procedure to her and she states that she was just seen a month ago and forgot to ask for it and Dr. Katrinka BlazingSmith told her she could just come back in and do a nurse visit   I explained with no documentation as to why she would need an ov  Best number [phone]

## 2017-02-07 NOTE — Telephone Encounter (Signed)
Called pt unable to reach. Message left on voicemail labs were sent over to her mychart.

## 2017-02-07 NOTE — Telephone Encounter (Signed)
Per Dr. Michaelle CopasSmith's lab note sent a rx for Metronidazole (Flagyl) [facility], [facility] [address] [phone]. Pt advised no alcohol intake while on medication.

## 2017-02-10 NOTE — Telephone Encounter (Signed)
See MyChart response.  Nurse visit for Tb skin test fine.

## 2017-02-15 ENCOUNTER — Telehealth: Payer: Self-pay | Admitting: *Deleted

## 2017-02-15 NOTE — Telephone Encounter (Signed)
Patient called because she stated the Flagyl is causing her vomiting and nausea. Please call something else to her pharmacy [facility] [phone]. Thank you.

## 2017-02-17 MED ORDER — METRONIDAZOLE 0.75 % VA GEL: 1.0000 | Freq: Two times a day (BID) | VAGINAL | 0 refills | Status: AC

## 2017-02-17 NOTE — Telephone Encounter (Signed)
Called and left message on AM to let the patient know , she has a new prescription to the pharmacy.

## 2017-02-17 NOTE — Telephone Encounter (Signed)
Call -- I sent in Metrogel vaginal to pharmacy for vaginal insertion.

## 2017-05-08 ENCOUNTER — Telehealth: Payer: Self-pay | Admitting: Family Medicine

## 2017-05-08 NOTE — Telephone Encounter (Signed)
Patient called in stating she is cramping and burning from UTI  And does not think she can make it through the night with the pain and wants to know if Dr Katrinka Blazing can call her something in so she doesn't have to go to the ER since she doesn't have insurance. I explained our call back time is 24-72 hours but she still wanted to me see if Dr Katrinka Blazing would get to it sooner.  Her call back number is [phone]

## 2017-05-11 NOTE — Telephone Encounter (Signed)
LMVM encouraging patient to RTC to be seen in order to be treated.  Told her in message that if she has no insurance and is a cash only payment, then she will receive a 55% discount at the end of the visit.

## 2017-06-08 NOTE — Telephone Encounter (Signed)
error 

## 2017-08-15 ENCOUNTER — Telehealth: Payer: Self-pay | Admitting: Family Medicine

## 2017-08-15 ENCOUNTER — Other Ambulatory Visit: Payer: Self-pay | Admitting: Family Medicine

## 2017-08-15 NOTE — Telephone Encounter (Signed)
Copied from CRM [id number]. Topic: Quick Communication - Rx Refill/Question >> Aug 15, 2017  2:32 PM Jolayne Hainesaylor, Brittany L wrote: Medication:  FLUoxetine (PROZAC) 20 MG tablet  Has the patient contacted their pharmacy? yes   (Agent: If no, request that the patient contact the pharmacy for the refill.)   [facility] [address]   Agent: Please be advised that RX refills may take up to 3 business days. We ask that you follow-up with your pharmacy.

## 2017-08-19 NOTE — Telephone Encounter (Signed)
Done on 11/5

## 2017-12-21 ENCOUNTER — Encounter: Payer: Self-pay | Admitting: Family Medicine

## 2018-02-12 ENCOUNTER — Other Ambulatory Visit: Payer: Self-pay | Admitting: Family Medicine

## 2018-02-12 MED ORDER — FLUOXETINE HCL 20 MG PO CAPS: ORAL_CAPSULE | ORAL | 0 refills | Status: AC

## 2018-02-12 NOTE — Telephone Encounter (Signed)
Copied from CRM [id number]. Topic: Quick Communication - See Telephone Encounter >> Feb 12, 2018  2:21 PM Sara Montoya, Sara Montoya, NT wrote: CRM for notification. See Telephone encounter for: 02/12/18. Patient called and states the she needs a refill of her FLUoxetine (PROZAC) 20 MG capsule  [facility] [address] - [place], [place] - [id number] [phone] (Phone) [phone] (Fax)

## 2018-03-20 ENCOUNTER — Encounter: Payer: Self-pay | Admitting: Obstetrics and Gynecology

## 2018-05-16 ENCOUNTER — Encounter: Payer: Self-pay | Admitting: Obstetrics and Gynecology

## 2019-08-14 ENCOUNTER — Ambulatory Visit: Payer: Self-pay | Attending: Internal Medicine

## 2019-08-14 DIAGNOSIS — Z20822 Contact with and (suspected) exposure to covid-19: Secondary | ICD-10-CM | POA: Insufficient documentation

## 2019-08-16 ENCOUNTER — Telehealth: Payer: Self-pay

## 2019-08-16 LAB — NOVEL CORONAVIRUS, NAA: SARS-CoV-2, NAA: NOT DETECTED

## 2019-08-16 NOTE — Telephone Encounter (Signed)
Pt. Called to request COVID test results.  Stated she is not able to pull it up on her current phone.  Advised that the result shows the virus was "not detected".  Pt. Verb. Understanding of results.   Stated she was tested due to poss. exposure at work.   Requested to have the results mailed to home address.  Home address was verified.  Advised that it may take 5-7 business days to receive results in the mail.  Stated she may call back and request for the results to be faxed if her Employer requires it sooner.

## 2020-01-14 ENCOUNTER — Ambulatory Visit
Admission: RE | Admit: 2020-01-14 | Discharge: 2020-01-14 | Disposition: A | Discharge status: Home / Self Care | Payer: No Typology Code available for payment source | Source: Ambulatory Visit | Attending: Family Medicine | Admitting: Family Medicine

## 2020-01-14 ENCOUNTER — Other Ambulatory Visit: Payer: Self-pay | Admitting: Family Medicine

## 2020-01-14 ENCOUNTER — Other Ambulatory Visit: Payer: Self-pay

## 2020-01-14 DIAGNOSIS — R0609 Other forms of dyspnea: Secondary | ICD-10-CM

## 2020-01-16 ENCOUNTER — Ambulatory Visit: Payer: Self-pay | Attending: Internal Medicine

## 2020-01-16 ENCOUNTER — Ambulatory Visit: Payer: Self-pay

## 2020-01-16 DIAGNOSIS — Z23 Encounter for immunization: Secondary | ICD-10-CM

## 2020-01-16 NOTE — Progress Notes (Signed)
   Covid-19 Vaccination Clinic  Name:  Sara Montoya    MRN: [id number] DOB: 1976/02/09  01/16/2020  [name] was observed post Covid-19 immunization for 15 minutes without incident. She was provided with Vaccine Information Sheet and instruction to access the V-Safe system.   [name] was instructed to call 911 with any severe reactions post vaccine: Marland Kitchen Difficulty breathing  . Swelling of face and throat  . A fast heartbeat  . A bad rash all over body  . Dizziness and weakness   Immunizations Administered    Name Date Dose VIS Date Route   Pfizer COVID-19 Vaccine 01/16/2020  1:27 PM 0.3 mL 09/25/2018 Intramuscular   Manufacturer: [employer], [employer]   Lot: [id number]   NDC: [address]-7

## 2020-01-22 ENCOUNTER — Other Ambulatory Visit: Payer: Self-pay

## 2020-01-22 DIAGNOSIS — Z1231 Encounter for screening mammogram for malignant neoplasm of breast: Secondary | ICD-10-CM

## 2020-02-04 ENCOUNTER — Ambulatory Visit: Payer: Self-pay

## 2020-02-13 ENCOUNTER — Ambulatory Visit: Payer: Self-pay

## 2020-02-22 ENCOUNTER — Other Ambulatory Visit: Payer: Self-pay

## 2020-02-22 ENCOUNTER — Ambulatory Visit: Payer: Self-pay | Attending: Internal Medicine

## 2020-02-22 DIAGNOSIS — Z23 Encounter for immunization: Secondary | ICD-10-CM

## 2020-02-22 NOTE — Progress Notes (Signed)
   Covid-19 Vaccination Clinic  Name:  AJAHNAE RATHGEBER    MRN: [id number] DOB: June 30, 1976  02/22/2020  Ms. Belanger was observed post Covid-19 immunization for 15 minutes without incident. She was provided with Vaccine Information Sheet and instruction to access the V-Safe system.   Ms. Butrick was instructed to call 911 with any severe reactions post vaccine: Marland Kitchen Difficulty breathing  . Swelling of face and throat  . A fast heartbeat  . A bad rash all over body  . Dizziness and weakness   Immunizations Administered    Name Date Dose VIS Date Route   Pfizer COVID-19 Vaccine 02/22/2020 12:23 PM 0.3 mL 09/25/2018 Intramuscular   Manufacturer: [employer], [employer]   Lot: [id number]   NDC: [address]-2

## 2020-03-03 ENCOUNTER — Ambulatory Visit: Payer: Self-pay

## 2020-03-18 ENCOUNTER — Telehealth: Payer: Self-pay

## 2020-03-18 NOTE — Telephone Encounter (Signed)
Returned patient's call about BCCCP appointment. Left appointment information on voicemail. Left name and number if patient has any other questions.

## 2020-04-14 ENCOUNTER — Ambulatory Visit: Payer: Self-pay

## 2020-05-05 ENCOUNTER — Ambulatory Visit: Payer: Self-pay

## 2020-06-02 ENCOUNTER — Ambulatory Visit
Admission: RE | Admit: 2020-06-02 | Discharge: 2020-06-02 | Disposition: A | Discharge status: Home / Self Care | Payer: No Typology Code available for payment source | Source: Ambulatory Visit | Attending: Obstetrics and Gynecology | Admitting: Obstetrics and Gynecology

## 2020-06-02 ENCOUNTER — Ambulatory Visit: Payer: No Typology Code available for payment source | Admitting: *Deleted

## 2020-06-02 ENCOUNTER — Other Ambulatory Visit: Payer: Self-pay

## 2020-06-02 VITALS — BP 106/62 | Temp 97.1°F | Wt 126.9 lb

## 2020-06-02 DIAGNOSIS — Z1231 Encounter for screening mammogram for malignant neoplasm of breast: Secondary | ICD-10-CM

## 2020-06-02 DIAGNOSIS — Z01419 Encounter for gynecological examination (general) (routine) without abnormal findings: Secondary | ICD-10-CM

## 2020-06-02 NOTE — Patient Instructions (Signed)
Explained breast self awareness with Efraim Kaufmann. Pap smear completed today. Let patient know that if today's Pap smear is normal that her next pap smear will be due in one year due to her history of abnormal Pap smear. Referred patient to the [facility] for a screening mammogram on mobile unit. Appointment scheduled Tuesday, June 02, 2020 at 1550. Patient escorted to mobile unit following BCCCP appointment. Let patient know will follow up with her within the next couple weeks with results of her Pap smear by letter or phone. Informed patient that the Breast Center will follow-up with her within the next couple of weeks with results of her mammogram by letter or phone. Discussed smoking cessation with patient. Referred to the [facility] Quitline and gave resources to the free smoking cessation classes at [facility]. Efraim Kaufmann verbalized understanding.  Alek Borges, Kathaleen Maser, RN 3:20 PM

## 2020-06-02 NOTE — Progress Notes (Signed)
[name] is a 44 y.o. G2P2002 female who presents to [facility] clinic today with complaint of AUB, heavy and painful periods. Referred patient to the Center for [facility] to follow-up. Patient given [facility] Application and informed that it will not be covered by BCCCP.   Pap Smear: Pap smear completed today. Last Pap smear was 01/06/2017 at [facility] clinic and was normal with negative HPV. Per patient has history of multiple abnormal Pap smears since 2000. Patient has two abnormal Pap smears in Epic 12/15/2015 that was ASCUS with positive HPV and 02/08/2013 ASCUS with positive HPV. Per patient has a history of two colposcopies completed for follow-up 07/26/2013 that was benign and one prior to 2014 that was completed at the [facility]. Last Pap smear result is available in Epic.   Physical exam: Breasts Breasts symmetrical. No skin abnormalities bilateral breasts. No nipple retraction bilateral breasts. No nipple discharge bilateral breasts. No lymphadenopathy. No lumps palpated bilateral breasts. No complaints of pain or tenderness on exam.      Pelvic/Bimanual Ext Genitalia No lesions, no swelling and no discharge observed on external genitalia.        Vagina Vagina pink and normal texture. No lesions or discharge observed in vagina.        Cervix Cervix is present. Cervix pink and of normal texture. No discharge observed.    Uterus Uterus is present and palpable. Uterus is tilted to the right and slightly enlarged. Patient complained of tenderness when palpated uterus.        Adnexae Bilateral ovaries present and palpable. No tenderness on palpation.         Rectovaginal No rectal exam completed today since patient had no rectal complaints. No skin abnormalities observed on exam.     Smoking History: Patient is a current smoker. Discussed smoking cessation with patient. Referred to the [facility] Quitline  and gave resources to the free smoking cessation classes at [facility].   Patient Navigation: Patient education provided. Access to services provided for patient through BCCCP program.    Breast and Cervical Cancer Risk Assessment: Patient does not have family history of breast cancer, known genetic mutations, or radiation treatment to the chest before age 45. Patient has history of cervical dysplasia. Patient has no history of being immunocompromised or DES exposure in-utero.  Risk Assessment    Risk Scores      06/02/2020   Last edited by: Narda Rutherford, LPN   5-year risk: 0.7 %   Lifetime risk: 9.6 %         A: BCCCP exam with pap smear No complaints.  P: Referred patient to the [facility] for a screening mammogram on mobile unit. Appointment scheduled Tuesday, June 02, 2020 at 1550.  Priscille Heidelberg, RN 06/02/2020 3:20 PM

## 2020-06-04 LAB — CYTOLOGY - PAP
Comment: NEGATIVE
Diagnosis: NEGATIVE
High risk HPV: NEGATIVE

## 2020-06-10 ENCOUNTER — Other Ambulatory Visit: Payer: Self-pay | Admitting: Obstetrics and Gynecology

## 2020-06-10 DIAGNOSIS — R928 Other abnormal and inconclusive findings on diagnostic imaging of breast: Secondary | ICD-10-CM

## 2020-06-30 ENCOUNTER — Other Ambulatory Visit: Payer: No Typology Code available for payment source

## 2020-07-04 ENCOUNTER — Ambulatory Visit
Admission: RE | Admit: 2020-07-04 | Discharge: 2020-07-04 | Disposition: A | Discharge status: Home / Self Care | Payer: No Typology Code available for payment source | Source: Ambulatory Visit | Attending: Obstetrics and Gynecology | Admitting: Obstetrics and Gynecology

## 2020-07-04 ENCOUNTER — Other Ambulatory Visit: Payer: Self-pay

## 2020-07-04 DIAGNOSIS — R928 Other abnormal and inconclusive findings on diagnostic imaging of breast: Secondary | ICD-10-CM

## 2020-07-20 ENCOUNTER — Encounter: Payer: Self-pay | Admitting: *Deleted

## 2020-07-20 ENCOUNTER — Encounter: Payer: No Typology Code available for payment source | Admitting: Obstetrics & Gynecology

## 2020-07-20 NOTE — Progress Notes (Signed)
[facility] referral for AUB. Per discussion needs to be rescheduled. Sent message to registrar to reschedule. Juanetta Negash,RN

## 2020-08-06 ENCOUNTER — Encounter: Payer: No Typology Code available for payment source | Admitting: Obstetrics & Gynecology

## 2020-09-15 ENCOUNTER — Encounter: Payer: No Typology Code available for payment source | Admitting: Certified Nurse Midwife

## 2021-01-12 IMAGING — MG DIGITAL SCREENING BILAT W/ TOMO W/ CAD
8 series · 8 of 24 positions shown · non-contrast
Comparison: None.

CLINICAL DATA: Screening.

EXAM:
DIGITAL SCREENING BILATERAL MAMMOGRAM WITH TOMO AND CAD

[R CC synth-2D]
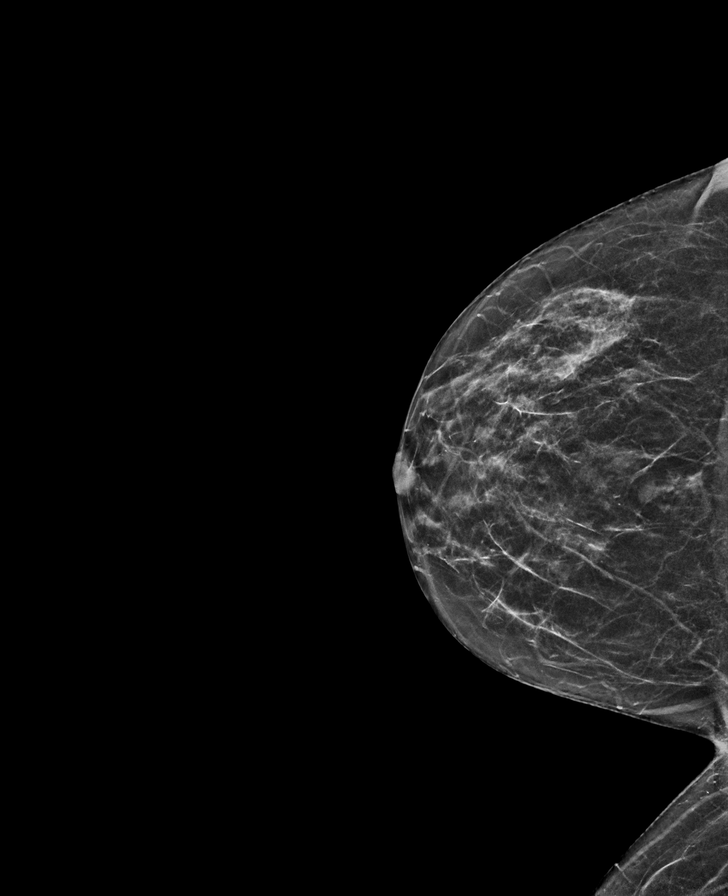

[L MLO synth-2D]
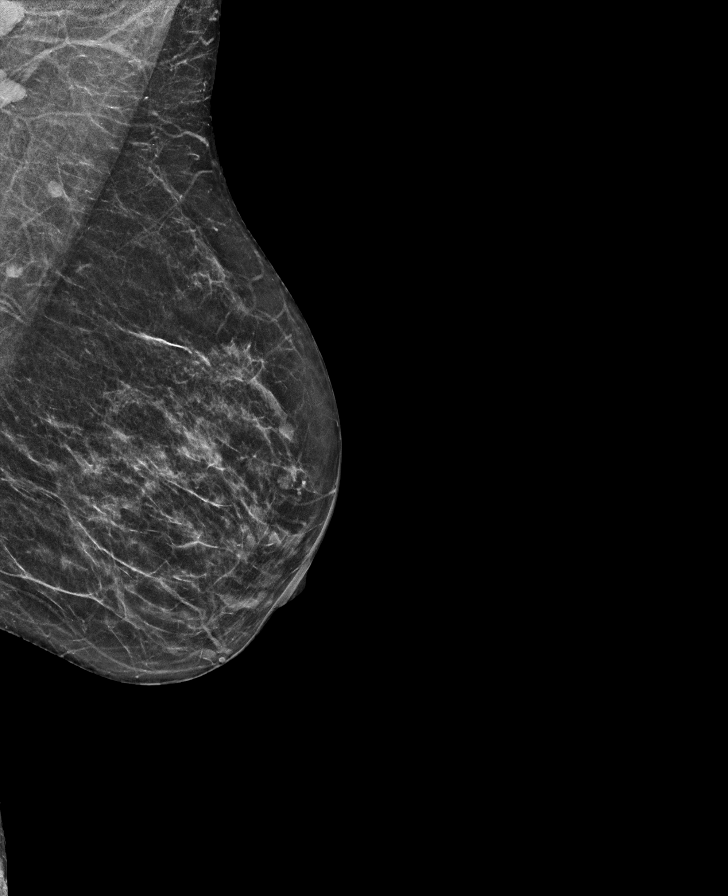

[R MLO synth-2D]
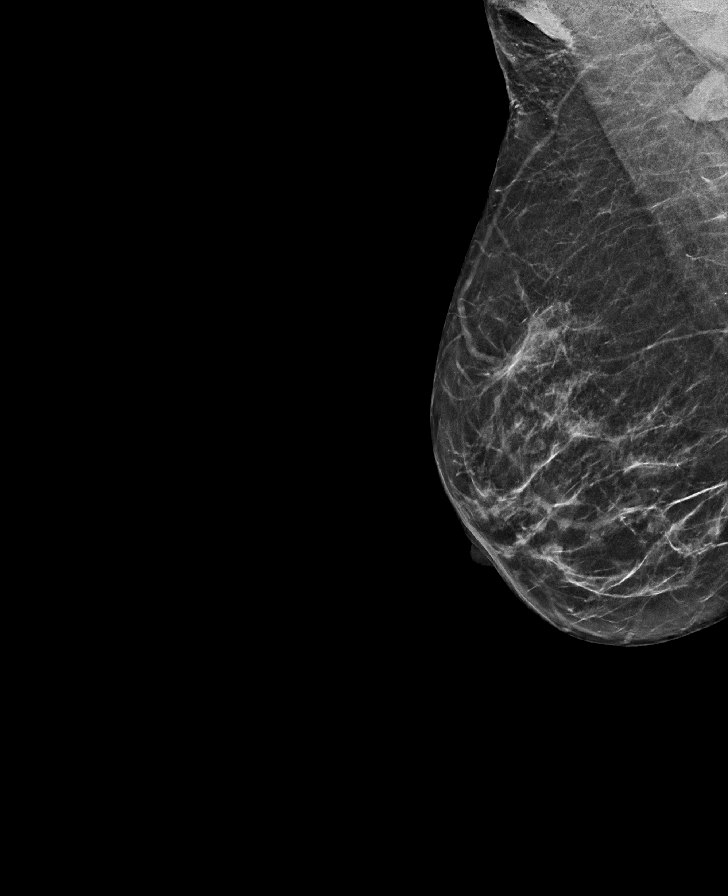

[L CC synth-2D]
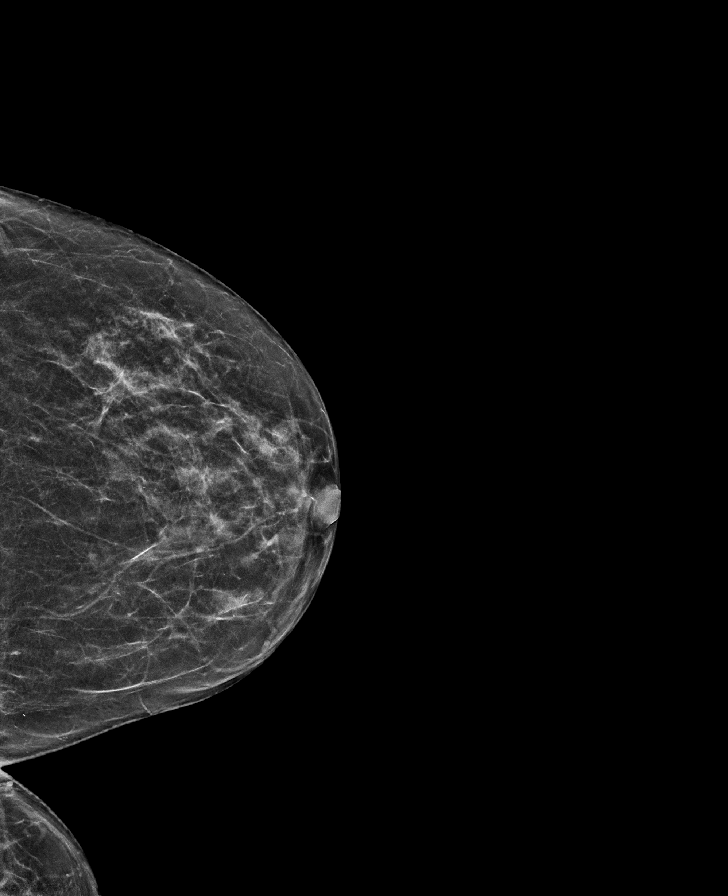

[R CC tomo · tomo slice 25/50.0]
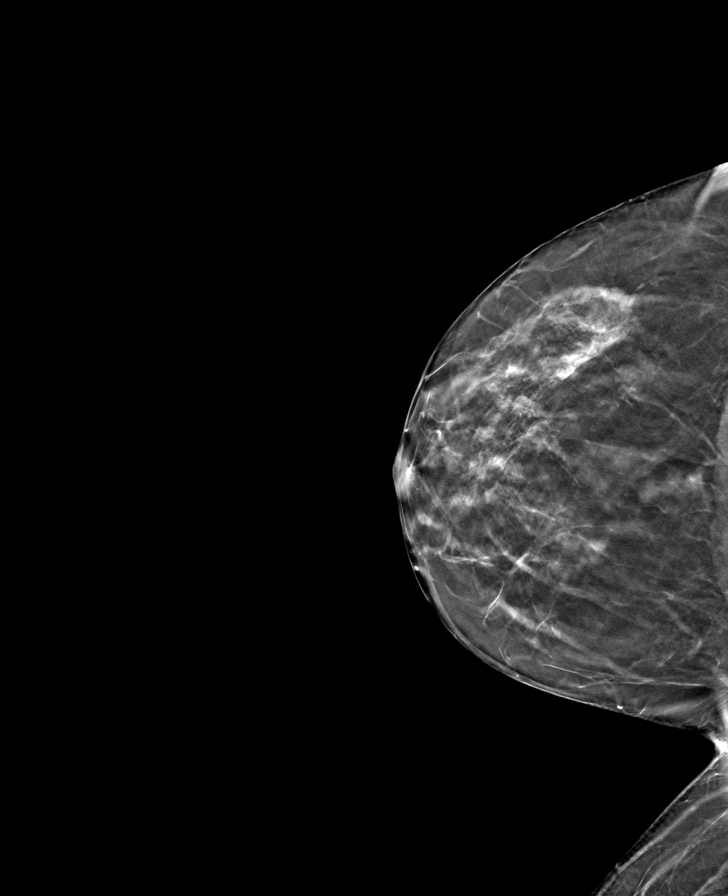

[L MLO tomo · tomo slice 25/50.0]
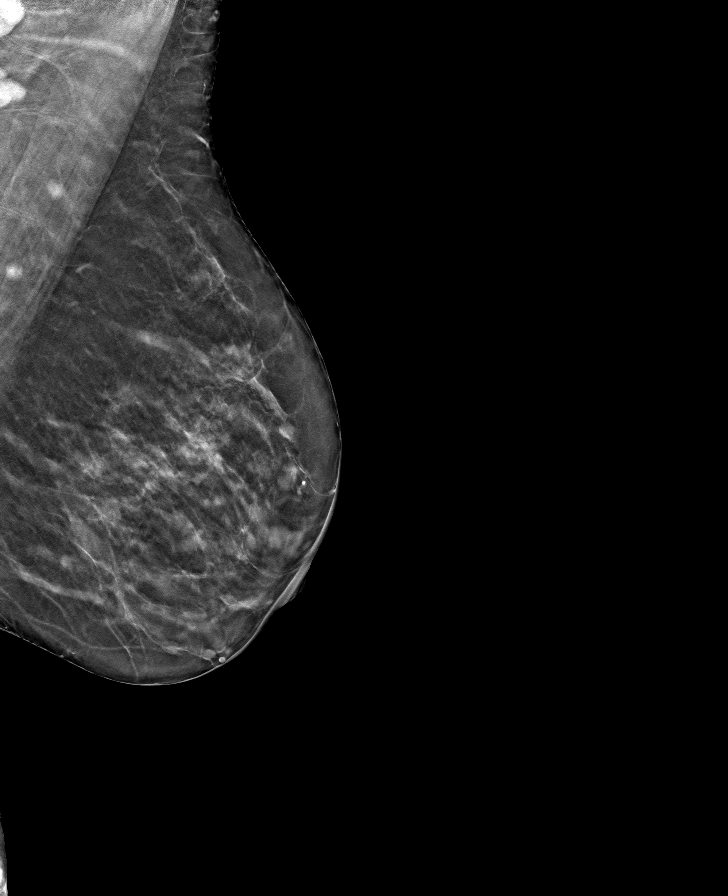

[R MLO tomo · tomo slice 27/54.0]
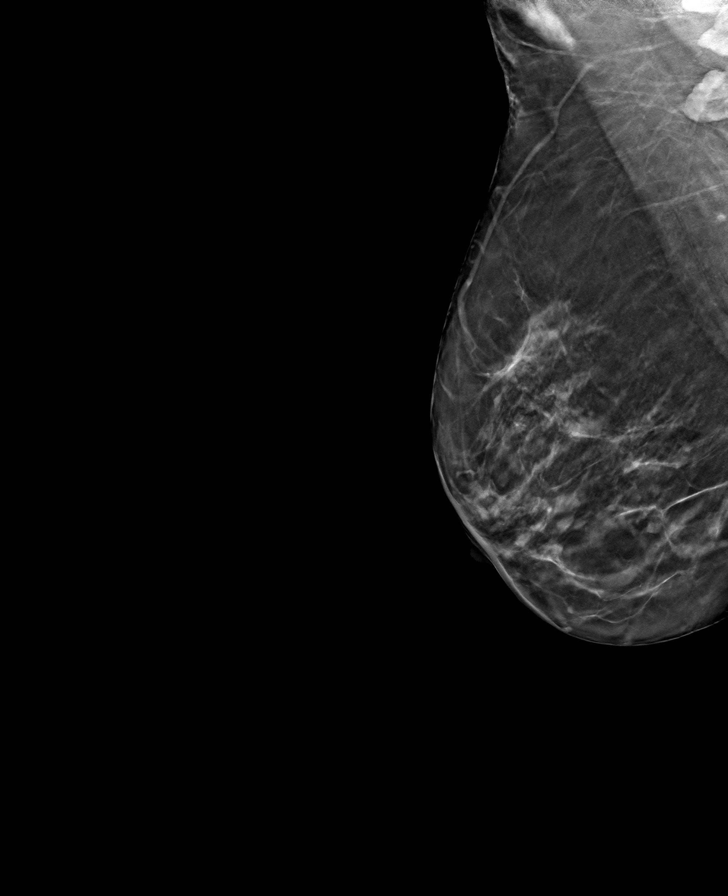

[L CC tomo · tomo slice 25/50.0]
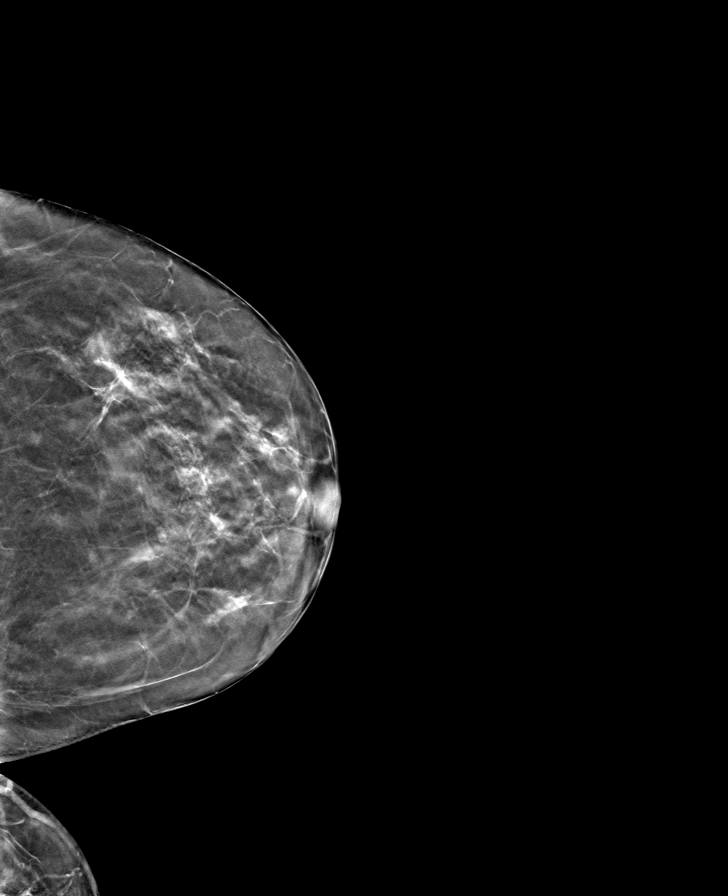

[8 of 24 positions shown; findings below may reference images not displayed]

ACR Breast Density Category b: There are scattered areas of
fibroglandular density.
FINDINGS: In the right breast, a possible asymmetry warrants further
evaluation. In the left breast, no findings suspicious for
malignancy. Images were processed with CAD.
IMPRESSION: Further evaluation is suggested for possible asymmetry in the right
breast.

RECOMMENDATION:
Diagnostic mammogram and possibly ultrasound of the right breast.
(Code:[id number])

The patient will be contacted regarding the findings, and additional
imaging will be scheduled.

BI-RADS CATEGORY  0: Incomplete. Need additional imaging evaluation
and/or prior mammograms for comparison.

## 2021-02-13 IMAGING — US US BREAST*R* LIMITED INC AXILLA
1 series · 5 of 5 positions shown · non-contrast
Comparison: Baseline screening mammogram dated 06/02/2020.

CLINICAL DATA: Screening recall for possible right breast
asymmetry.

EXAM:
DIGITAL DIAGNOSTIC UNILATERAL RIGHT MAMMOGRAM WITH TOMO AND CAD;
ULTRASOUND RIGHT BREAST LIMITED

[Series 1: us breast*right* limited inc axilla · 0.06mm/px · 5 of 5 slices shown]
[im 1/5]
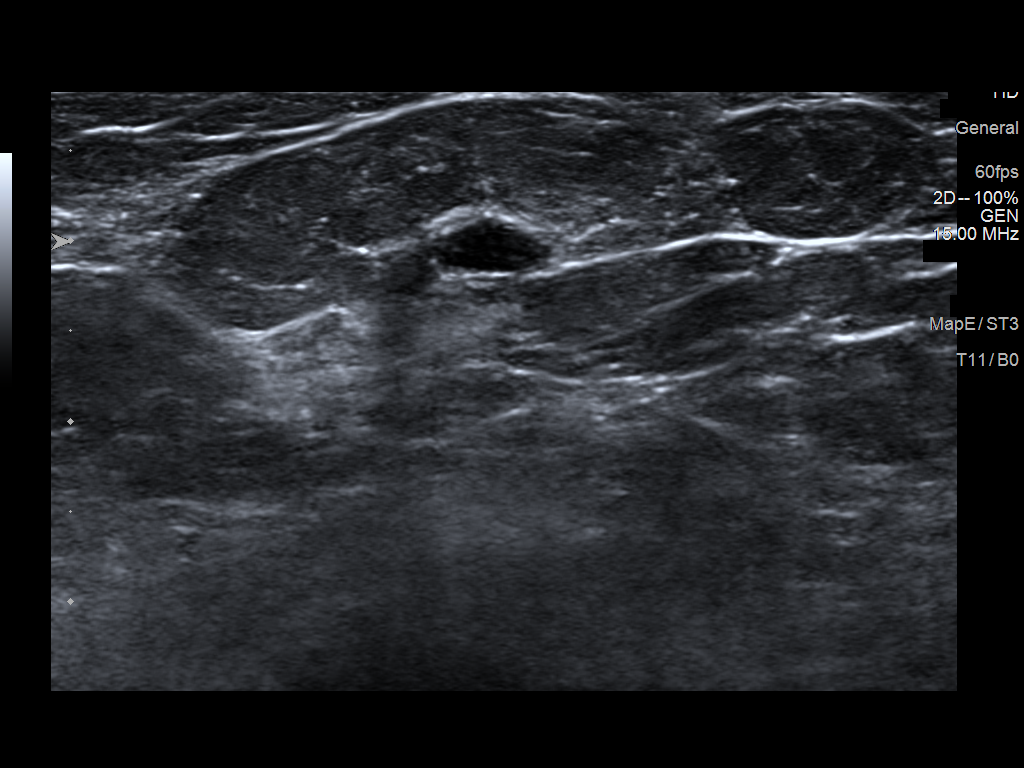
[im 2/5]
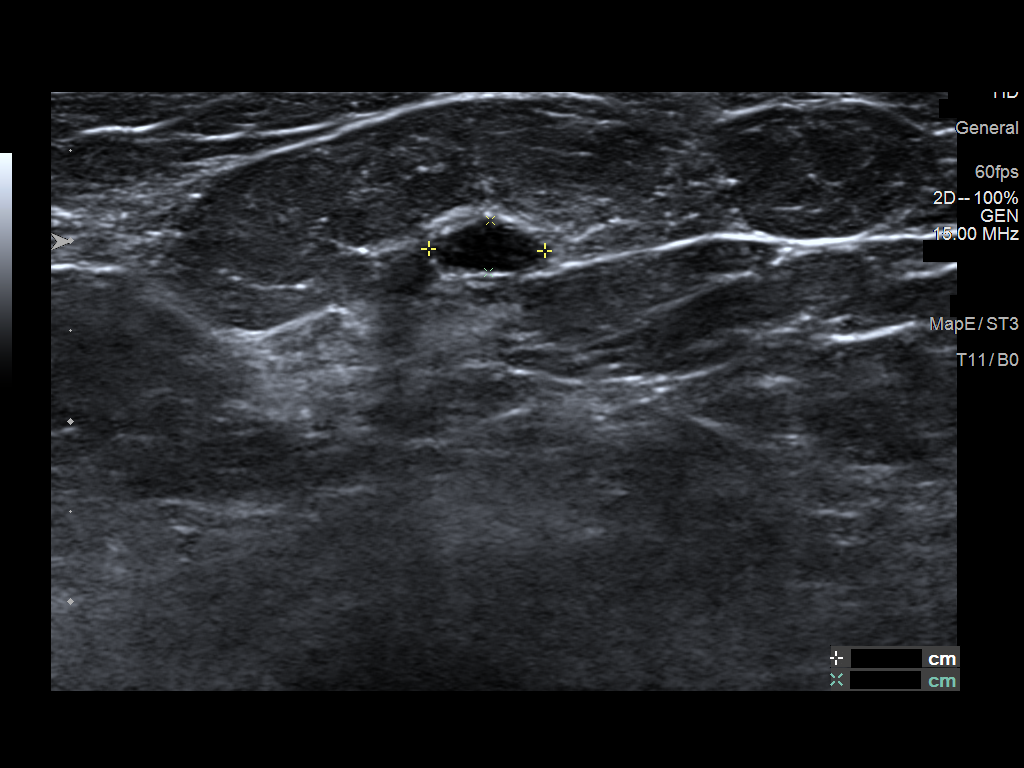
[im 3/5]
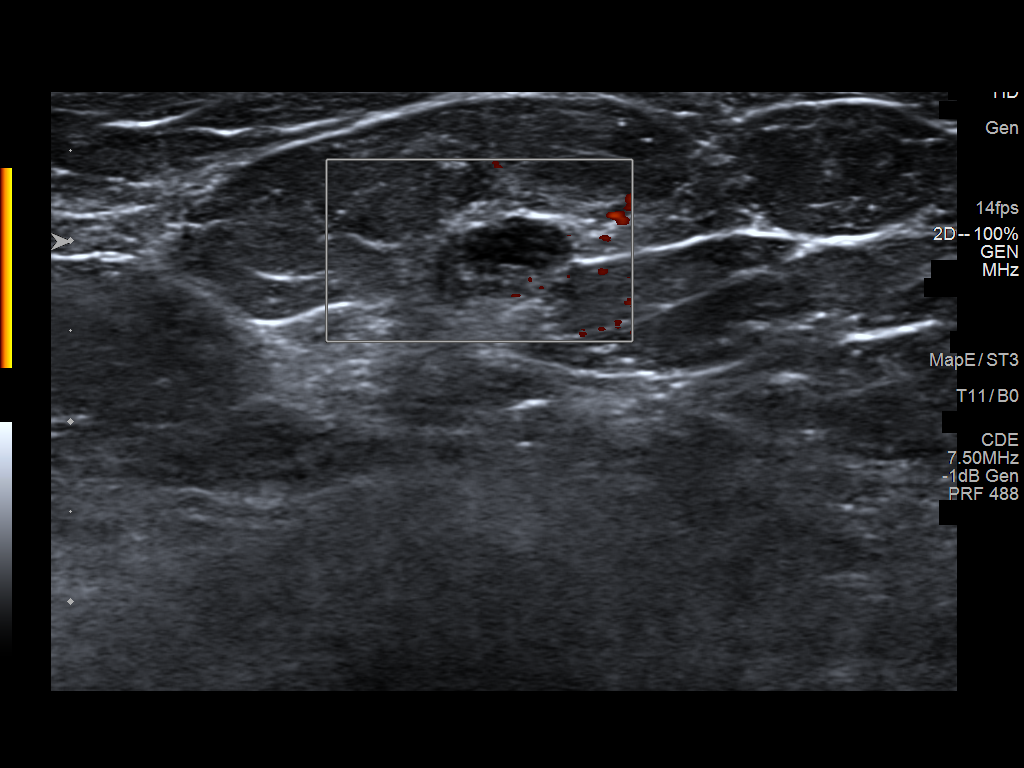
[im 4/5]
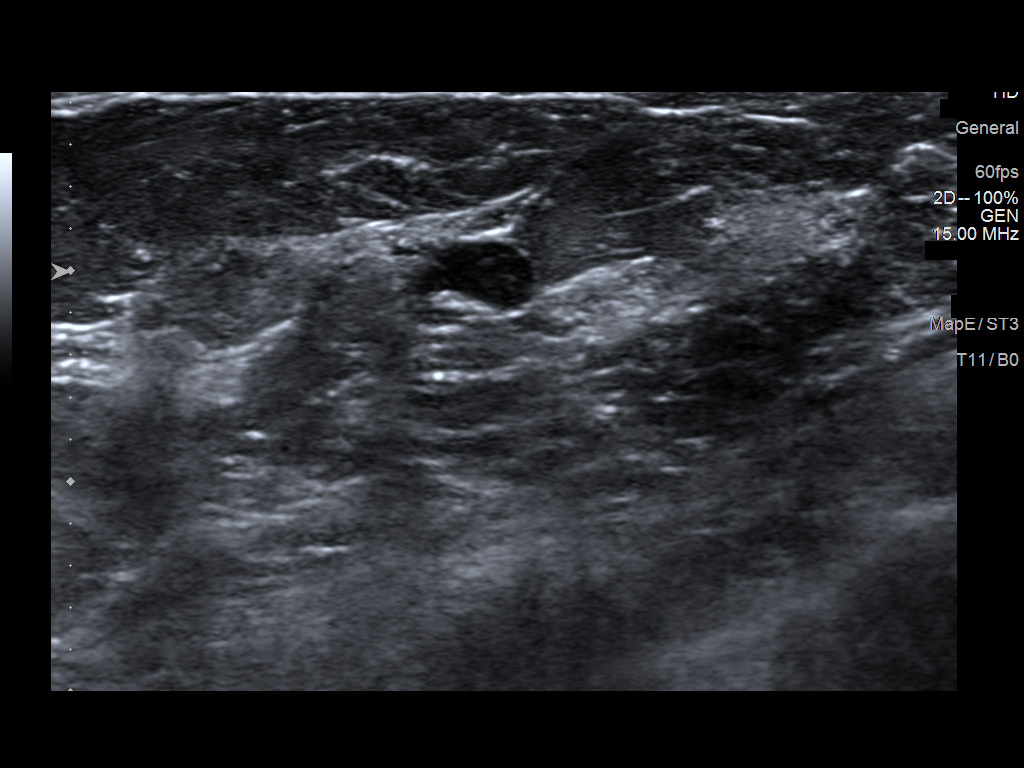
[im 5/5]
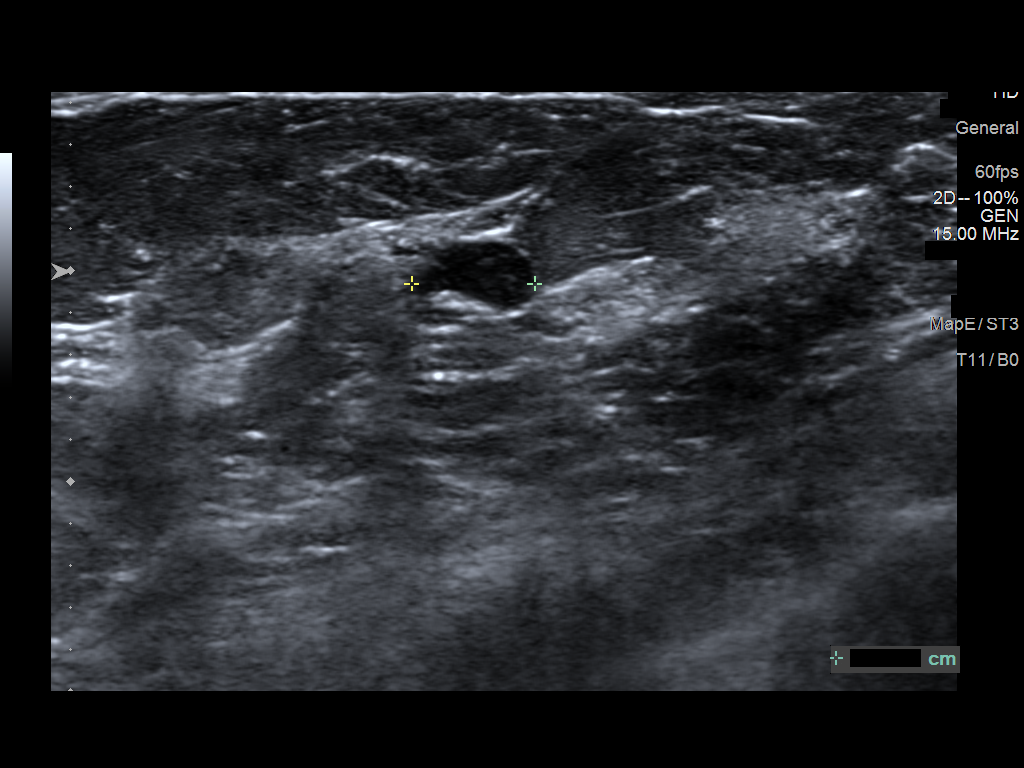

[5 of 5 positions shown; findings below may reference images not displayed]

ACR Breast Density Category b: There are scattered areas of
fibroglandular density.
FINDINGS: Spot compression tomograms were performed of the right breast. There
is an oval mass in the lower central right breast measuring
approximately 0.8 cm.

Mammographic images were processed with CAD.

Targeted ultrasound of the right breast was performed. There is a
cyst at 6 o'clock 4 cm from nipple measuring 0.6 x 0.3 x 0.6 cm.
This corresponds well with the mass seen in the lower central right
breast at mammography.
IMPRESSION: Right breast cyst.  No findings of malignancy in the right breast.

RECOMMENDATION:
Screening mammogram in one year.(Code:[id number])

I have discussed the findings and recommendations with the patient.
If applicable, a reminder letter will be sent to the patient
regarding the next appointment.

BI-RADS CATEGORY  2: Benign.

## 2022-09-02 ENCOUNTER — Other Ambulatory Visit: Payer: Self-pay | Admitting: Physician Assistant

## 2022-09-02 DIAGNOSIS — N939 Abnormal uterine and vaginal bleeding, unspecified: Secondary | ICD-10-CM

## 2022-09-02 LAB — HM PAP SMEAR

## 2022-09-02 LAB — RESULTS CONSOLE HPV: CHL HPV: NEGATIVE

## 2022-09-05 ENCOUNTER — Other Ambulatory Visit: Payer: Self-pay | Admitting: Physician Assistant

## 2022-09-05 DIAGNOSIS — Z1231 Encounter for screening mammogram for malignant neoplasm of breast: Secondary | ICD-10-CM

## 2023-03-30 ENCOUNTER — Other Ambulatory Visit: Payer: Self-pay | Admitting: Physician Assistant

## 2023-03-30 DIAGNOSIS — Z1231 Encounter for screening mammogram for malignant neoplasm of breast: Secondary | ICD-10-CM

## 2023-07-12 ENCOUNTER — Ambulatory Visit: Payer: No Typology Code available for payment source

## 2023-08-08 ENCOUNTER — Ambulatory Visit: Payer: No Typology Code available for payment source

## 2023-08-23 ENCOUNTER — Ambulatory Visit
Admission: RE | Admit: 2023-08-23 | Discharge: 2023-08-23 | Disposition: A | Discharge status: Home / Self Care | Payer: Medicaid Other | Source: Ambulatory Visit | Attending: Physician Assistant | Admitting: Physician Assistant

## 2023-08-23 DIAGNOSIS — Z1231 Encounter for screening mammogram for malignant neoplasm of breast: Secondary | ICD-10-CM

## 2024-06-17 ENCOUNTER — Encounter: Admitting: Obstetrics and Gynecology

## 2024-08-09 ENCOUNTER — Other Ambulatory Visit: Payer: Self-pay

## 2024-08-12 ENCOUNTER — Encounter: Admitting: Obstetrics and Gynecology

## 2024-08-20 ENCOUNTER — Ambulatory Visit: Admitting: Dermatology

## 2024-09-18 ENCOUNTER — Encounter: Admitting: Certified Nurse Midwife
